# Patient Record
Sex: Female | Born: 1990 | Race: White | Hispanic: No | Marital: Married | Smoking: Never smoker
Health system: Southern US, Community
[De-identification: ages and names within clinical notes are randomized; demographics above are authoritative.]

## PROBLEM LIST (undated history)

## (undated) DIAGNOSIS — Z8489 Family history of other specified conditions: Secondary | ICD-10-CM

## (undated) DIAGNOSIS — E282 Polycystic ovarian syndrome: Secondary | ICD-10-CM

## (undated) DIAGNOSIS — J45909 Unspecified asthma, uncomplicated: Secondary | ICD-10-CM

## (undated) HISTORY — PX: GASTRIC BYPASS: SHX52

## (undated) HISTORY — PX: WISDOM TOOTH EXTRACTION: SHX21

## (undated) HISTORY — DX: Unspecified asthma, uncomplicated: J45.909

---
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
## 2009-10-13 HISTORY — PX: DILATION AND CURETTAGE OF UTERUS: SHX78

## 2011-10-15 ENCOUNTER — Other Ambulatory Visit (HOSPITAL_COMMUNITY): Payer: Self-pay | Admitting: Gastroenterology

## 2011-10-15 DIAGNOSIS — R1011 Right upper quadrant pain: Secondary | ICD-10-CM

## 2011-10-21 ENCOUNTER — Encounter (HOSPITAL_COMMUNITY)
Admission: RE | Admit: 2011-10-21 | Discharge: 2011-10-21 | Disposition: A | Discharge status: Home / Self Care | Payer: BC Managed Care – PPO | Source: Ambulatory Visit | Attending: Gastroenterology | Admitting: Gastroenterology

## 2011-10-21 DIAGNOSIS — R1011 Right upper quadrant pain: Secondary | ICD-10-CM

## 2011-10-21 MED ORDER — SINCALIDE 5 MCG IJ SOLR
INTRAMUSCULAR | Status: AC
  Administered 2011-10-21: 2.73 ug
  Filled 2011-10-21: qty 5

## 2011-10-21 MED ORDER — SINCALIDE 5 MCG IJ SOLR
INTRAMUSCULAR | Status: AC
  Administered 2011-10-21: 2.73 ug via INTRAVENOUS
  Filled 2011-10-21: qty 5

## 2011-10-21 MED ORDER — TECHNETIUM TC 99M MEBROFENIN IV KIT
5.0000 | PACK | Freq: Once | INTRAVENOUS | Status: AC | PRN
  Administered 2011-10-21: 5 via INTRAVENOUS

## 2011-10-21 MED ORDER — SINCALIDE 5 MCG IJ SOLR
0.0200 ug/kg | Freq: Once | INTRAMUSCULAR | Status: AC
  Administered 2011-10-21: 2.73 ug via INTRAVENOUS

## 2015-07-02 ENCOUNTER — Encounter: Payer: Self-pay | Admitting: Family Medicine

## 2015-07-02 ENCOUNTER — Ambulatory Visit (INDEPENDENT_AMBULATORY_CARE_PROVIDER_SITE_OTHER): Payer: 59 | Admitting: Family Medicine

## 2015-07-02 DIAGNOSIS — Z23 Encounter for immunization: Secondary | ICD-10-CM | POA: Diagnosis not present

## 2015-07-02 DIAGNOSIS — Z319 Encounter for procreative management, unspecified: Secondary | ICD-10-CM | POA: Diagnosis not present

## 2015-07-02 LAB — COMPREHENSIVE METABOLIC PANEL
ALT: 80 U/L — AB (ref 0–53)
AST: 50 U/L — AB (ref 0–37)
Albumin: 4.1 g/dL (ref 3.5–5.2)
Alkaline Phosphatase: 50 U/L (ref 39–117)
BILIRUBIN TOTAL: 0.4 mg/dL (ref 0.2–1.2)
BUN: 11 mg/dL (ref 6–23)
CHLORIDE: 103 meq/L (ref 96–112)
CO2: 30 meq/L (ref 19–32)
CREATININE: 0.68 mg/dL (ref 0.40–1.50)
Calcium: 9.5 mg/dL (ref 8.4–10.5)
GFR: 149.45 mL/min (ref >60.00)
GLUCOSE: 90 mg/dL (ref 70–99)
Potassium: 4.3 mEq/L (ref 3.5–5.1)
Sodium: 138 mEq/L (ref 135–145)
Total Protein: 7.4 g/dL (ref 6.0–8.3)

## 2015-07-02 LAB — TSH: TSH: 2.96 u[IU]/mL (ref 0.35–4.50)

## 2015-07-02 LAB — HEMOGLOBIN A1C: HEMOGLOBIN A1C: 5.6 % (ref 4.6–6.5)

## 2015-07-02 NOTE — Progress Notes (Signed)
Subjective:   Patient ID: Alexis Miller, female    DOB: [birth year], 24 y.o.   MRN: [id number]  Alexis Miller is a pleasant 24 y.o. year old female who presents to clinic today with Establish Care and Weight Gain  on 07/02/2015  HPI:  Currently trying to get pregnant.  On fourth round of femara through her OBGYN.  Wants to try lose weight.  Admits to eating out a lot.  Has tried low carb diet but no other diets.  Not exercising much now but was going to the gym almost nightly and got frustrated because she was not seeing any results.  No current outpatient prescriptions on file prior to visit.   No current facility-administered medications on file prior to visit.    No Known Allergies  Past Medical History  Diagnosis Date  . Asthma     Past Surgical History  Procedure Laterality Date  . Dilation and curettage of uterus  2011    Family History  Problem Relation Age of Onset  . Arthritis Mother   . Arthritis Father   . Hyperlipidemia Father   . Hypertension Father   . Arthritis Maternal Grandmother   . Arthritis Maternal Grandfather   . Heart disease Maternal Grandfather   . Hypertension Maternal Grandfather   . Arthritis Paternal Grandmother   . Arthritis Paternal Grandfather   . Hypertension Paternal Grandfather     Social History   Social History  . Marital Status: Married    Spouse Name: N/A  . Number of Children: N/A  . Years of Education: N/A   Occupational History  . Not on file.   Social History Main Topics  . Smoking status: Never Smoker   . Smokeless tobacco: Never Used  . Alcohol Use: No  . Drug Use: No  . Sexual Activity: Yes   Other Topics Concern  . Not on file   Social History Narrative  . No narrative on file   The PMH, PSH, Social History, Family History, Medications, and allergies have been reviewed in [facility], and have been updated if relevant.   Review of Systems  Constitutional: Negative.   HENT: Negative.   Respiratory: Negative.    Cardiovascular: Negative.   Gastrointestinal: Negative.   Endocrine: Negative.   Genitourinary: Negative.   Musculoskeletal: Negative.   Skin: Negative.   Allergic/Immunologic: Negative.   Neurological: Negative.   Hematological: Negative.   Psychiatric/Behavioral: Negative.   All other systems reviewed and are negative.      Objective:    BP 118/72 mmHg  Pulse 68  Temp(Src) 97.8 F (36.6 C) (Oral)  Ht  (1.702 m)  Wt 321 lb 4 oz (145.718 kg)  BMI 50.30 kg/m2  SpO2 96%   Physical Exam  Constitutional: She is oriented to person, place, and time. She appears well-developed and well-nourished.  obese  HENT:  Head: Normocephalic.  Eyes: Conjunctivae are normal.  Cardiovascular: Normal rate.   Pulmonary/Chest: Effort normal and breath sounds normal.  Musculoskeletal: Normal range of motion.  Neurological: She is alert and oriented to person, place, and time. No cranial nerve deficit.  Skin: Skin is warm and dry.  Psychiatric: She has a normal mood and affect. Her behavior is normal. Judgment and thought content normal.  Nursing note and vitals reviewed.         Assessment & Plan:   Need for influenza vaccination - Plan: Flu Vaccine QUAD 36+ mos PF IM (Fluarix & Fluzone Quad PF)  Morbid obesity No Follow-up  on file.

## 2015-07-02 NOTE — Assessment & Plan Note (Signed)
>  25 minutes spent in face to face time with patient, >50% spent in counselling or coordination of care Deteriorated. Declines referral to nutritionist. Wants to work on diet and come back to see me. We discussed phentermine which is NOT safe to be taken in pregnancy. She will touch base with me after this round of femara is complete. The patient indicates understanding of these issues and agrees with the plan.

## 2015-07-02 NOTE — Patient Instructions (Signed)
Great to meet you. We will call you with your lab results.  Please in touch with me.

## 2015-07-02 NOTE — Progress Notes (Signed)
Pre visit review using our clinic review tool, if applicable. No additional management support is needed unless otherwise documented below in the visit note. 

## 2015-07-23 ENCOUNTER — Ambulatory Visit (INDEPENDENT_AMBULATORY_CARE_PROVIDER_SITE_OTHER): Payer: 59 | Admitting: Family Medicine

## 2015-07-23 ENCOUNTER — Encounter: Payer: Self-pay | Admitting: Family Medicine

## 2015-07-23 MED ORDER — PHENTERMINE HCL 15 MG PO CAPS: 15.0000 mg | ORAL_CAPSULE | ORAL | Status: DC

## 2015-07-23 NOTE — Patient Instructions (Signed)
Great to see you. We are starting phentermine 15 mg daily.  Please come see in one month.

## 2015-07-23 NOTE — Progress Notes (Signed)
Subjective:   Patient ID: Alexis Miller, female    DOB: 17-Aug-1991, 24 y.o.   MRN: [id number]  Alexis Miller is a pleasant 24 y.o. year old female who presents to clinic today with Follow-up  on 07/23/2015  HPI:  Wants to try lose weight. Admits to eating out a lot. Has tried low carb diet but no other diets.  Not exercising much now but was going to the gym almost nightly and got frustrated because she was not seeing any results.  She and her boyfriend decided not to get pregnant now.  Stopped taking femara.  Wants to try to lose weight and get healthy first.  Lab Results  Component Value Date   TSH 2.96 07/02/2015   Wt Readings from Last 3 Encounters:  07/23/15 321 lb 8 oz (145.831 kg)  07/02/15 321 lb 4 oz (145.718 kg)    Current Outpatient Prescriptions on File Prior to Visit  Medication Sig Dispense Refill  . Prenatal Vit-Fe Fumarate-FA (PRENATAL MULTIVITAMIN) TABS tablet Take 1 tablet by mouth daily at 12 noon.     No current facility-administered medications on file prior to visit.    No Known Allergies  Past Medical History  Diagnosis Date  . Asthma     Past Surgical History  Procedure Laterality Date  . Dilation and curettage of uterus  2011    Family History  Problem Relation Age of Onset  . Arthritis Mother   . Arthritis Father   . Hyperlipidemia Father   . Hypertension Father   . Arthritis Maternal Grandmother   . Arthritis Maternal Grandfather   . Heart disease Maternal Grandfather   . Hypertension Maternal Grandfather   . Arthritis Paternal Grandmother   . Arthritis Paternal Grandfather   . Hypertension Paternal Grandfather     Social History   Social History  . Marital Status: Married    Spouse Name: N/A  . Number of Children: N/A  . Years of Education: N/A   Occupational History  . Not on file.   Social History Main Topics  . Smoking status: Never Smoker   . Smokeless tobacco: Never Used  . Alcohol Use: No  . Drug Use: No  .  Sexual Activity: Yes   Other Topics Concern  . Not on file   Social History Narrative   The PMH, PSH, Social History, Family History, Medications, and allergies have been reviewed in [facility], and have been updated if relevant.   Review of Systems  Constitutional: Negative.   Eyes: Negative.   Respiratory: Negative.   Cardiovascular: Negative.   Gastrointestinal: Negative.   Endocrine: Negative.   Genitourinary: Negative.   Musculoskeletal: Negative.   Skin: Negative.   Neurological: Negative.   Hematological: Negative.   Psychiatric/Behavioral: Negative.   All other systems reviewed and are negative.      Objective:    BP 128/70 mmHg  Pulse 85  Temp(Src) 98.4 F (36.9 C) (Oral)  Wt 321 lb 8 oz (145.831 kg)  SpO2 97%  LMP 07/18/2015   Physical Exam  Constitutional: She is oriented to person, place, and time. She appears well-developed and well-nourished. No distress.  HENT:  Head: Normocephalic and atraumatic.  Eyes: Conjunctivae are normal.  Cardiovascular: Normal rate and normal heart sounds.   Pulmonary/Chest: Effort normal.  Neurological: She is alert and oriented to person, place, and time. No cranial nerve deficit. Coordination normal.  Skin: Skin is warm and dry.  Psychiatric: She has a normal mood and affect. Her behavior is  normal. Judgment and thought content normal.  Nursing note and vitals reviewed.         Assessment & Plan:   Morbid obesity, unspecified obesity type (HCC) No Follow-up on file.

## 2015-07-23 NOTE — Assessment & Plan Note (Signed)
Discussed weight loss plan. She declines nutritionist referral. Pt would also like to discuss medication options- discussed phentermine risk benefits, side effects including HTN, pulmonary HTN, stroke.    She would like to start phentermine and lifestyle changes.  Follow up in 1 month.  If BMI < 27 will decrease to half dose x 1 month then stop

## 2015-07-23 NOTE — Progress Notes (Signed)
Pre visit review using our clinic review tool, if applicable. No additional management support is needed unless otherwise documented below in the visit note. 

## 2015-08-23 ENCOUNTER — Ambulatory Visit: Payer: 59 | Admitting: Family Medicine

## 2015-08-28 ENCOUNTER — Encounter: Payer: Self-pay | Admitting: Family Medicine

## 2015-08-28 ENCOUNTER — Ambulatory Visit (INDEPENDENT_AMBULATORY_CARE_PROVIDER_SITE_OTHER): Payer: 59 | Admitting: Family Medicine

## 2015-08-28 MED ORDER — PHENTERMINE HCL 30 MG PO CAPS: 30.0000 mg | ORAL_CAPSULE | ORAL | Status: DC

## 2015-08-28 NOTE — Assessment & Plan Note (Signed)
Doing well. Encouraged her to continue lifestyle changes. She would like to to increase dose of phentermine to 30 mg daily- rx printed and given to pt. She is aware of the risks of phentermine. Follow up in 3 months. The patient indicates understanding of these issues and agrees with the plan.

## 2015-08-28 NOTE — Progress Notes (Signed)
Subjective:   Patient ID: Alexis Miller, female    DOB: 1991/01/28, 24 y.o.   MRN: [id number]  Alexis Miller is a pleasant 24 y.o. year old female who presents to clinic today with Follow-up  on 08/28/2015  HPI:  Obesity- Started phentermine 15 mg daily last month.   Not exercising much now but was going to the gym almost nightly and got frustrated because she was not seeing any results.  Denies CP, SOB, HA, blurred vision or palpitations.  Doing well.  Feels like it is not suppressing her appetite as much as it did initially.   Lab Results  Component Value Date   TSH 2.96 07/02/2015   Wt Readings from Last 3 Encounters:  08/28/15 316 lb 4 oz (143.45 kg)  07/23/15 321 lb 8 oz (145.831 kg)  07/02/15 321 lb 4 oz (145.718 kg)    Current Outpatient Prescriptions on File Prior to Visit  Medication Sig Dispense Refill  . phentermine 15 MG capsule Take 1 capsule (15 mg total) by mouth every morning. 30 capsule 0  . Prenatal Vit-Fe Fumarate-FA (PRENATAL MULTIVITAMIN) TABS tablet Take 1 tablet by mouth daily at 12 noon.     No current facility-administered medications on file prior to visit.    No Known Allergies  Past Medical History  Diagnosis Date  . Asthma     Past Surgical History  Procedure Laterality Date  . Dilation and curettage of uterus  2011    Family History  Problem Relation Age of Onset  . Arthritis Mother   . Arthritis Father   . Hyperlipidemia Father   . Hypertension Father   . Arthritis Maternal Grandmother   . Arthritis Maternal Grandfather   . Heart disease Maternal Grandfather   . Hypertension Maternal Grandfather   . Arthritis Paternal Grandmother   . Arthritis Paternal Grandfather   . Hypertension Paternal Grandfather     Social History   Social History  . Marital Status: Married    Spouse Name: N/A  . Number of Children: N/A  . Years of Education: N/A   Occupational History  . Not on file.   Social History Main Topics  .  Smoking status: Never Smoker   . Smokeless tobacco: Never Used  . Alcohol Use: No  . Drug Use: No  . Sexual Activity: Yes   Other Topics Concern  . Not on file   Social History Narrative   The PMH, PSH, Social History, Family History, Medications, and allergies have been reviewed in [facility], and have been updated if relevant.   Review of Systems  Constitutional: Negative.   Eyes: Negative.   Respiratory: Negative.   Cardiovascular: Negative.   Gastrointestinal: Negative.   Endocrine: Negative.   Genitourinary: Negative.   Musculoskeletal: Negative.   Skin: Negative.   Neurological: Negative.   Hematological: Negative.   Psychiatric/Behavioral: Negative.   All other systems reviewed and are negative.      Objective:    BP 122/78 mmHg  Pulse 79  Temp(Src) 97.9 F (36.6 C) (Oral)  Wt 316 lb 4 oz (143.45 kg)  SpO2 98%  LMP 08/10/2015 (Approximate)   Physical Exam  Constitutional: She is oriented to person, place, and time. She appears well-developed and well-nourished. No distress.  HENT:  Head: Normocephalic and atraumatic.  Eyes: Conjunctivae are normal.  Cardiovascular: Normal rate and normal heart sounds.   Pulmonary/Chest: Effort normal.  Neurological: She is alert and oriented to person, place, and time. No cranial nerve deficit. Coordination normal.  Skin: Skin is warm and dry.  Psychiatric: She has a normal mood and affect. Her behavior is normal. Judgment and thought content normal.  Nursing note and vitals reviewed.         Assessment & Plan:   Morbid obesity, unspecified obesity type (HCC) No Follow-up on file.

## 2015-08-28 NOTE — Patient Instructions (Signed)
Great to see you. Please come see me in 3 months. 

## 2015-08-28 NOTE — Progress Notes (Signed)
Pre visit review using our clinic review tool, if applicable. No additional management support is needed unless otherwise documented below in the visit note. 

## 2015-11-28 ENCOUNTER — Ambulatory Visit (INDEPENDENT_AMBULATORY_CARE_PROVIDER_SITE_OTHER): Payer: BLUE CROSS/BLUE SHIELD | Admitting: Family Medicine

## 2015-11-28 ENCOUNTER — Encounter: Payer: Self-pay | Admitting: Family Medicine

## 2015-11-28 MED ORDER — PHENTERMINE HCL 37.5 MG PO CAPS: 37.5000 mg | ORAL_CAPSULE | ORAL | Status: DC

## 2015-11-28 NOTE — Patient Instructions (Signed)
Great to see you. We are increasing your phentermine to 37.5 mg daily.  Come see me in 3 months.  I am so proud of you!

## 2015-11-28 NOTE — Progress Notes (Signed)
Pre visit review using our clinic review tool, if applicable. No additional management support is needed unless otherwise documented below in the visit note. 

## 2015-11-28 NOTE — Progress Notes (Signed)
Subjective:   Patient ID: Alexis Miller, female    DOB: 1991-10-12, 25 y.o.   MRN: [id number]  Alexis Wain is a pleasant 25 y.o. year old female who presents to clinic today with Follow-up  on 11/28/2015  HPI:  Obesity- Started phentermine 15 mg daily in 07/2015.  Tolerated this well.  At follow up visit in 08/2015, dosage increased to 30 mg daily.  Denies CP, SOB, HA, blurred vision or palpitations.  Doing well.  Has lost a total of 20 pounds since starting phentermine!  Lab Results  Component Value Date   TSH 2.96 07/02/2015   Wt Readings from Last 3 Encounters:  11/28/15 304 lb 4 oz (138.007 kg)  08/28/15 316 lb 4 oz (143.45 kg)  07/23/15 321 lb 8 oz (145.831 kg)    Current Outpatient Prescriptions on File Prior to Visit  Medication Sig Dispense Refill  . phentermine 30 MG capsule Take 1 capsule (30 mg total) by mouth every morning. 30 capsule 2   No current facility-administered medications on file prior to visit.    No Known Allergies  Past Medical History  Diagnosis Date  . Asthma     Past Surgical History  Procedure Laterality Date  . Dilation and curettage of uterus  2011    Family History  Problem Relation Age of Onset  . Arthritis Mother   . Arthritis Father   . Hyperlipidemia Father   . Hypertension Father   . Arthritis Maternal Grandmother   . Arthritis Maternal Grandfather   . Heart disease Maternal Grandfather   . Hypertension Maternal Grandfather   . Arthritis Paternal Grandmother   . Arthritis Paternal Grandfather   . Hypertension Paternal Grandfather     Social History   Social History  . Marital Status: Married    Spouse Name: N/A  . Number of Children: N/A  . Years of Education: N/A   Occupational History  . Not on file.   Social History Main Topics  . Smoking status: Never Smoker   . Smokeless tobacco: Never Used  . Alcohol Use: No  . Drug Use: No  . Sexual Activity: Yes   Other Topics Concern  . Not on file    Social History Narrative   The PMH, PSH, Social History, Family History, Medications, and allergies have been reviewed in [facility], and have been updated if relevant.   Review of Systems  Constitutional: Negative.   Eyes: Negative.   Respiratory: Negative.   Cardiovascular: Negative.   Gastrointestinal: Negative.   Endocrine: Negative.   Genitourinary: Negative.   Musculoskeletal: Negative.   Skin: Negative.   Neurological: Negative.   Hematological: Negative.   Psychiatric/Behavioral: Negative.   All other systems reviewed and are negative.      Objective:    BP 116/76 mmHg  Pulse 88  Temp(Src) 98.5 F (36.9 C) (Oral)  Wt 304 lb 4 oz (138.007 kg)  LMP 11/04/2015   Physical Exam  Constitutional: She is oriented to person, place, and time. She appears well-developed and well-nourished. No distress.  HENT:  Head: Normocephalic and atraumatic.  Eyes: Conjunctivae are normal.  Cardiovascular: Normal rate and normal heart sounds.   Pulmonary/Chest: Effort normal.  Neurological: She is alert and oriented to person, place, and time. No cranial nerve deficit. Coordination normal.  Skin: Skin is warm and dry.  Psychiatric: She has a normal mood and affect. Her behavior is normal. Judgment and thought content normal.  Nursing note and vitals reviewed.  Assessment & Plan:   Morbid obesity, unspecified obesity type (HCC) No Follow-up on file.

## 2015-12-28 ENCOUNTER — Other Ambulatory Visit: Payer: Self-pay | Admitting: Family Medicine

## 2015-12-28 NOTE — Telephone Encounter (Signed)
Last f/u 11/2015 

## 2015-12-28 NOTE — Telephone Encounter (Signed)
Ok to refill one time only. 

## 2016-01-01 NOTE — Telephone Encounter (Signed)
Rx called in to requested pharmacy 

## 2016-02-01 ENCOUNTER — Other Ambulatory Visit: Payer: Self-pay | Admitting: Family Medicine

## 2016-02-24 DIAGNOSIS — J019 Acute sinusitis, unspecified: Secondary | ICD-10-CM | POA: Diagnosis not present

## 2016-02-25 ENCOUNTER — Ambulatory Visit: Payer: BLUE CROSS/BLUE SHIELD | Admitting: Family Medicine

## 2016-08-13 DIAGNOSIS — N390 Urinary tract infection, site not specified: Secondary | ICD-10-CM | POA: Diagnosis not present

## 2016-08-28 DIAGNOSIS — N926 Irregular menstruation, unspecified: Secondary | ICD-10-CM | POA: Diagnosis not present

## 2016-08-28 DIAGNOSIS — E282 Polycystic ovarian syndrome: Secondary | ICD-10-CM | POA: Diagnosis not present

## 2016-08-28 DIAGNOSIS — M545 Low back pain: Secondary | ICD-10-CM | POA: Diagnosis not present

## 2016-12-23 DIAGNOSIS — N390 Urinary tract infection, site not specified: Secondary | ICD-10-CM | POA: Diagnosis not present

## 2016-12-23 DIAGNOSIS — R300 Dysuria: Secondary | ICD-10-CM | POA: Diagnosis not present

## 2017-01-14 ENCOUNTER — Ambulatory Visit (INDEPENDENT_AMBULATORY_CARE_PROVIDER_SITE_OTHER): Payer: BLUE CROSS/BLUE SHIELD | Admitting: Family Medicine

## 2017-01-14 VITALS — BP 124/84 | HR 101 | Wt 335.8 lb

## 2017-01-14 DIAGNOSIS — M545 Low back pain: Secondary | ICD-10-CM

## 2017-01-14 LAB — POC URINALSYSI DIPSTICK (AUTOMATED)
BILIRUBIN UA: NEGATIVE
Glucose, UA: NEGATIVE
KETONES UA: NEGATIVE
Nitrite, UA: NEGATIVE
PH UA: 6 (ref 5.0–8.0)
PROTEIN UA: 15
Spec Grav, UA: 1.03 (ref 1.030–1.035)
Urobilinogen, UA: 0.2 (ref ?–2.0)

## 2017-01-14 MED ORDER — MELOXICAM 15 MG PO TABS: 15.0000 mg | ORAL_TABLET | Freq: Every day | ORAL | 0 refills | Status: DC

## 2017-01-14 NOTE — Progress Notes (Signed)
SUBJECTIVE:  Alexis Miller is a 26 y.o. female who complains of low back pain for 2 week(s), positional with bending or lifting, without radiation down the legs. Precipitating factors: none recalled by the patient. Prior history of back problems: recurrent self limited episodes of low back pain in the past. There is no numbness in the legs.   No current outpatient prescriptions on file prior to visit.   No current facility-administered medications on file prior to visit.     Allergies  Allergen Reactions  . Cephalosporins Hives    Past Medical History:  Diagnosis Date  . Asthma     Past Surgical History:  Procedure Laterality Date  . DILATION AND CURETTAGE OF UTERUS  2011    Family History  Problem Relation Age of Onset  . Arthritis Mother   . Arthritis Father   . Hyperlipidemia Father   . Hypertension Father   . Arthritis Maternal Grandmother   . Arthritis Maternal Grandfather   . Heart disease Maternal Grandfather   . Hypertension Maternal Grandfather   . Arthritis Paternal Grandmother   . Arthritis Paternal Grandfather   . Hypertension Paternal Grandfather     Social History   Social History  . Marital status: Married    Spouse name: N/A  . Number of children: N/A  . Years of education: N/A   Occupational History  . Not on file.   Social History Main Topics  . Smoking status: Never Smoker  . Smokeless tobacco: Never Used  . Alcohol use No  . Drug use: No  . Sexual activity: Yes   Other Topics Concern  . Not on file   Social History Narrative  . No narrative on file   The PMH, PSH, Social History, Family History, Medications, and allergies have been reviewed in [facility], and have been updated if relevant.  OBJECTIVE:  BP 124/84   Pulse (!) 101   Wt (!) 335 lb 12 oz (152.3 kg)   SpO2 97%   BMI 52.59 kg/m   Patient appears to be in mild to moderate pain, antalgic gait noted. Lumbosacral spine area reveals no local tenderness or mass.  Painful and  reduced LS ROM noted. Straight leg raise is negative bilaterally  ASSESSMENT:  lumbar strain and without radiculopathy  PLAN: For acute pain, rest, intermittent application of heat (do not sleep on heating pad), analgesics are recommended. Discussed longer term treatment plan of prn NSAID's and discussed a home back care exercise program with flexion exercise routine. Proper lifting with avoidance of heavy lifting discussed. Consider Physical Therapy and XRay studies if not improving. Call or return to clinic prn if these symptoms worsen or fail to improve as anticipated.

## 2017-01-14 NOTE — Patient Instructions (Signed)
Great to see you. Take meloxicam 15 mg daily with food for next two weeks. Please keep me updated.

## 2017-01-15 LAB — URINE CULTURE

## 2017-02-10 ENCOUNTER — Other Ambulatory Visit: Payer: Self-pay | Admitting: Family Medicine

## 2017-02-10 NOTE — Telephone Encounter (Signed)
Spoke to pt. She said her back is doing better and the pharmacy must have put it on auto refill. She said she did not need it

## 2017-02-10 NOTE — Telephone Encounter (Signed)
Need to call the pt to see how her back is doing.

## 2017-03-03 ENCOUNTER — Other Ambulatory Visit: Payer: Self-pay | Admitting: Family Medicine

## 2017-03-03 NOTE — Telephone Encounter (Signed)
Last refill 01/14/17 #30, last OV 01/14/17

## 2017-04-06 ENCOUNTER — Other Ambulatory Visit: Payer: Self-pay | Admitting: Family Medicine

## 2017-10-26 DIAGNOSIS — R1084 Generalized abdominal pain: Secondary | ICD-10-CM | POA: Diagnosis not present

## 2017-12-24 DIAGNOSIS — Z8742 Personal history of other diseases of the female genital tract: Secondary | ICD-10-CM | POA: Diagnosis not present

## 2017-12-24 DIAGNOSIS — Z01419 Encounter for gynecological examination (general) (routine) without abnormal findings: Secondary | ICD-10-CM | POA: Diagnosis not present

## 2017-12-24 DIAGNOSIS — Z6841 Body Mass Index (BMI) 40.0 and over, adult: Secondary | ICD-10-CM | POA: Diagnosis not present

## 2017-12-24 DIAGNOSIS — Z881 Allergy status to other antibiotic agents status: Secondary | ICD-10-CM | POA: Diagnosis not present

## 2018-01-25 DIAGNOSIS — E119 Type 2 diabetes mellitus without complications: Secondary | ICD-10-CM | POA: Diagnosis not present

## 2018-01-25 DIAGNOSIS — E282 Polycystic ovarian syndrome: Secondary | ICD-10-CM | POA: Diagnosis not present

## 2018-02-14 DIAGNOSIS — J069 Acute upper respiratory infection, unspecified: Secondary | ICD-10-CM | POA: Diagnosis not present

## 2019-12-08 DIAGNOSIS — M545 Low back pain: Secondary | ICD-10-CM | POA: Diagnosis not present

## 2019-12-08 DIAGNOSIS — Z6841 Body Mass Index (BMI) 40.0 and over, adult: Secondary | ICD-10-CM | POA: Diagnosis not present

## 2019-12-08 DIAGNOSIS — R350 Frequency of micturition: Secondary | ICD-10-CM | POA: Diagnosis not present

## 2019-12-29 DIAGNOSIS — Z20822 Contact with and (suspected) exposure to covid-19: Secondary | ICD-10-CM | POA: Diagnosis not present

## 2020-07-22 DIAGNOSIS — Z20822 Contact with and (suspected) exposure to covid-19: Secondary | ICD-10-CM | POA: Diagnosis not present

## 2020-07-22 DIAGNOSIS — J3089 Other allergic rhinitis: Secondary | ICD-10-CM | POA: Diagnosis not present

## 2020-08-13 DIAGNOSIS — U071 COVID-19: Secondary | ICD-10-CM | POA: Diagnosis not present

## 2020-08-13 DIAGNOSIS — Z20822 Contact with and (suspected) exposure to covid-19: Secondary | ICD-10-CM | POA: Diagnosis not present

## 2020-08-16 DIAGNOSIS — U071 COVID-19: Secondary | ICD-10-CM | POA: Diagnosis not present

## 2020-08-20 DIAGNOSIS — H1030 Unspecified acute conjunctivitis, unspecified eye: Secondary | ICD-10-CM | POA: Diagnosis not present

## 2020-08-20 DIAGNOSIS — R059 Cough, unspecified: Secondary | ICD-10-CM | POA: Diagnosis not present

## 2020-10-01 ENCOUNTER — Ambulatory Visit
Admission: EM | Admit: 2020-10-01 | Discharge: 2020-10-01 | Disposition: A | Discharge status: Home / Self Care | Payer: BLUE CROSS/BLUE SHIELD | Attending: Sports Medicine | Admitting: Sports Medicine

## 2020-10-01 ENCOUNTER — Encounter: Payer: Self-pay | Admitting: Emergency Medicine

## 2020-10-01 ENCOUNTER — Other Ambulatory Visit: Payer: Self-pay

## 2020-10-01 DIAGNOSIS — J0100 Acute maxillary sinusitis, unspecified: Secondary | ICD-10-CM

## 2020-10-01 DIAGNOSIS — H9202 Otalgia, left ear: Secondary | ICD-10-CM

## 2020-10-01 MED ORDER — AMOXICILLIN-POT CLAVULANATE 875-125 MG PO TABS: 1.0000 | ORAL_TABLET | Freq: Two times a day (BID) | ORAL | 0 refills | Status: DC

## 2020-10-01 NOTE — Discharge Instructions (Addendum)
I did recommend going ahead and treating her for the sinusitis.  We will give her Augmentin for 7 days. Over-the-counter meds as needed, Mucinex over-the-counter, cough medicine as needed.  Supportive care. Follow-up here or with her primary care physician should symptoms not improve or worsen.

## 2020-10-01 NOTE — ED Triage Notes (Addendum)
Pt c/o nasal congestion, post nasal drainage, sinus pressure, headache, cough and left ear pain. Started about a week ago. Denies fever. Pt states she tested positive for covid on November 1st 2021

## 2020-10-01 NOTE — ED Provider Notes (Signed)
[facility]    CSN: [id number] Arrival date & time: 10/01/20  1757      History   Chief Complaint Chief Complaint  Patient presents with  . Nasal Congestion         HPI [place] R Riggenbach is a 29 y.o. female.   Pleasant 29 year old female who presents for evaluation of 7 to 10 days of upper respiratory tract symptoms.  She reports sinus pressure congestion postnasal drip and left ear and throat pain.  She denies any fever shakes chills.  She did have Covid 7 weeks ago.  She has not been vaccinated against Covid or influenza yet.  She denies any exposure to influenza or Covid.  She denies any nausea vomiting diarrhea abdominal pain or urinary symptoms.  Given the fact that has been going on for 7 to 10 days she presents today for initial evaluation in the urgent care.  No red flag signs or symptoms offered by the patient.       Past Medical History:  Diagnosis Date  . Asthma     Patient Active Problem List   Diagnosis Date Noted  . Morbid obesity (HCC) 07/02/2015    Past Surgical History:  Procedure Laterality Date  . DILATION AND CURETTAGE OF UTERUS  2011    OB History   No obstetric history on file.      Home Medications    Prior to Admission medications   Medication Sig Start Date End Date Taking? Authorizing Provider  amoxicillin-clavulanate (AUGMENTIN) 875-125 MG tablet Take 1 tablet by mouth every 12 (twelve) hours. 10/01/20   Delton See, MD  meloxicam (MOBIC) 15 MG tablet TAKE 1 TABLET (15 MG TOTAL) BY MOUTH DAILY. 04/06/17   Dianne Dun, MD    Family History Family History  Problem Relation Age of Onset  . Arthritis Mother   . Arthritis Father   . Hyperlipidemia Father   . Hypertension Father   . Arthritis Maternal Grandmother   . Arthritis Maternal Grandfather   . Heart disease Maternal Grandfather   . Hypertension Maternal Grandfather   . Arthritis Paternal Grandmother   . Arthritis Paternal Grandfather   . Hypertension  Paternal Grandfather     Social History Social History   Tobacco Use  . Smoking status: Never Smoker  . Smokeless tobacco: Never Used  Vaping Use  . Vaping Use: Never used  Substance Use Topics  . Alcohol use: No    Alcohol/week: 0.0 standard drinks  . Drug use: No     Allergies   Cephalosporins   Review of Systems Review of Systems  Constitutional: Negative for activity change, appetite change, chills, fatigue and fever.  HENT: Positive for congestion, ear pain, postnasal drip, sinus pressure, sinus pain and sore throat.   Eyes: Negative for pain.  Respiratory: Positive for cough. Negative for choking, chest tightness, shortness of breath, wheezing and stridor.   Cardiovascular: Negative for chest pain.  Gastrointestinal: Positive for abdominal pain.  Genitourinary: Negative for dysuria.  Skin: Negative for color change, pallor, rash and wound.  Neurological: Negative for dizziness.  All other systems reviewed and are negative.    Physical Exam Triage Vital Signs ED Triage Vitals  Enc Vitals Group     BP 10/01/20 1854 (!) 157/90     Pulse Rate 10/01/20 1854 83     Resp 10/01/20 1854 18     Temp 10/01/20 1854 98.2 F (36.8 C)     Temp Source 10/01/20 1854 Oral  SpO2 10/01/20 1854 99 %     Weight 10/01/20 1853 (!) 335 lb 12.2 oz (152.3 kg)     Height 10/01/20 1853 5\' 7"  (1.702 m)     Head Circumference --      Peak Flow --      Pain Score 10/01/20 1852 8     Pain Loc --      Pain Edu? --      Excl. in GC? --    No data found.  Updated Vital Signs BP (!) 157/90 (BP Location: Right Arm)   Pulse 83   Temp 98.2 F (36.8 C) (Oral)   Resp 18   Ht 5\' 7"  (1.702 m)   Wt (!) 152.3 kg   LMP 09/17/2020 (Approximate)   SpO2 99%   BMI 52.59 kg/m   Visual Acuity Right Eye Distance:   Left Eye Distance:   Bilateral Distance:    Right Eye Near:   Left Eye Near:    Bilateral Near:     Physical Exam Vitals and nursing note reviewed.  Constitutional:       General: She is not in acute distress.    Appearance: Normal appearance. She is not ill-appearing, toxic-appearing or diaphoretic.  HENT:     Head: Normocephalic and atraumatic.     Right Ear: Tympanic membrane normal.     Left Ear: Tympanic membrane normal.     Nose: Nose normal.     Mouth/Throat:     Mouth: Mucous membranes are moist.     Pharynx: Oropharynx is clear. No posterior oropharyngeal erythema.  Eyes:     Extraocular Movements: Extraocular movements intact.     Conjunctiva/sclera: Conjunctivae normal.     Pupils: Pupils are equal, round, and reactive to light.  Cardiovascular:     Rate and Rhythm: Normal rate and regular rhythm.     Pulses: Normal pulses.     Heart sounds: Normal heart sounds. No murmur heard. No friction rub. No gallop.   Pulmonary:     Effort: Pulmonary effort is normal. No respiratory distress.     Breath sounds: Normal breath sounds. No stridor. No wheezing, rhonchi or rales.  Musculoskeletal:     Cervical back: Normal range of motion and neck supple.  Lymphadenopathy:     Cervical: Cervical adenopathy present.  Skin:    General: Skin is warm and dry.     Capillary Refill: Capillary refill takes less than 2 seconds.  Neurological:     General: No focal deficit present.     Mental Status: She is alert and oriented to person, place, and time.  Psychiatric:        Mood and Affect: Mood normal.      UC Treatments / Results  Labs (all labs ordered are listed, but only abnormal results are displayed) Labs Reviewed - No data to display  EKG   Radiology No results found.  Procedures Procedures (including critical care time)  Medications Ordered in UC Medications - No data to display  Initial Impression / Assessment and Plan / UC Course  I have reviewed the triage vital signs and the nursing notes.  Pertinent labs & imaging results that were available during my care of the patient were reviewed by me and considered in my medical  decision making (see chart for details).  Clinical impression: 7 to 10 days of sinus pressure congestion, postnasal drip and left ear pain.  Exam and clinical presentation consistent with subacute sinusitis.  Patient did have Covid  7 weeks ago.  Treatment plan: 1.  The findings and treat plan were discussed detail the patient.  Patient was in agreement. 2.  I did recommend going ahead and treating her for the sinusitis.  We will give her Augmentin for 7 days. 3.  Over-the-counter meds as needed, Mucinex over-the-counter, cough medicine as needed.  Supportive care. 4.  Follow-up here or with her primary care physician should symptoms not improve or worsen.     Final Clinical Impressions(s) / UC Diagnoses   Final diagnoses:  Subacute maxillary sinusitis  Left ear pain     Discharge Instructions     I did recommend going ahead and treating her for the sinusitis.  We will give her Augmentin for 7 days. Over-the-counter meds as needed, Mucinex over-the-counter, cough medicine as needed.  Supportive care. Follow-up here or with her primary care physician should symptoms not improve or worsen.    ED Prescriptions    Medication Sig Dispense Auth. Provider   amoxicillin-clavulanate (AUGMENTIN) 875-125 MG tablet Take 1 tablet by mouth every 12 (twelve) hours. 14 tablet Delton See, MD     PDMP not reviewed this encounter.   Delton See, MD 10/01/20 [phone]

## 2020-10-13 DIAGNOSIS — M5126 Other intervertebral disc displacement, lumbar region: Secondary | ICD-10-CM

## 2020-10-13 HISTORY — DX: Other intervertebral disc displacement, lumbar region: M51.26

## 2020-10-16 ENCOUNTER — Ambulatory Visit
Admission: EM | Admit: 2020-10-16 | Discharge: 2020-10-16 | Disposition: A | Discharge status: Home / Self Care | Payer: BLUE CROSS/BLUE SHIELD | Attending: Family Medicine | Admitting: Family Medicine

## 2020-10-16 ENCOUNTER — Encounter: Payer: Self-pay | Admitting: Emergency Medicine

## 2020-10-16 ENCOUNTER — Other Ambulatory Visit: Payer: Self-pay

## 2020-10-16 DIAGNOSIS — J4541 Moderate persistent asthma with (acute) exacerbation: Secondary | ICD-10-CM

## 2020-10-16 DIAGNOSIS — R059 Cough, unspecified: Secondary | ICD-10-CM

## 2020-10-16 MED ORDER — PREDNISONE 10 MG (21) PO TBPK: ORAL_TABLET | Freq: Every day | ORAL | 0 refills | Status: AC

## 2020-10-16 MED ORDER — GUAIFENESIN-CODEINE 100-10 MG/5ML PO SYRP: 5.0000 mL | ORAL_SOLUTION | Freq: Three times a day (TID) | ORAL | 0 refills | Status: DC | PRN

## 2020-10-16 NOTE — ED Provider Notes (Incomplete)
[facility]    CSN: [id number] Arrival date & time: 10/16/20  1701      History   Chief Complaint No chief complaint on file.   HPI Alexis Miller is a 30 y.o. female.   HPI  Past Medical History:  Diagnosis Date  . Asthma     Patient Active Problem List   Diagnosis Date Noted  . Morbid obesity (HCC) 07/02/2015    Past Surgical History:  Procedure Laterality Date  . DILATION AND CURETTAGE OF UTERUS  2011    OB History   No obstetric history on file.      Home Medications    Prior to Admission medications   Medication Sig Start Date End Date Taking? Authorizing Provider  amoxicillin-clavulanate (AUGMENTIN) 875-125 MG tablet Take 1 tablet by mouth every 12 (twelve) hours. 10/01/20   Delton See, MD  meloxicam (MOBIC) 15 MG tablet TAKE 1 TABLET (15 MG TOTAL) BY MOUTH DAILY. 04/06/17   Dianne Dun, MD    Family History Family History  Problem Relation Age of Onset  . Arthritis Mother   . Arthritis Father   . Hyperlipidemia Father   . Hypertension Father   . Arthritis Maternal Grandmother   . Arthritis Maternal Grandfather   . Heart disease Maternal Grandfather   . Hypertension Maternal Grandfather   . Arthritis Paternal Grandmother   . Arthritis Paternal Grandfather   . Hypertension Paternal Grandfather     Social History Social History   Tobacco Use  . Smoking status: Never Smoker  . Smokeless tobacco: Never Used  Vaping Use  . Vaping Use: Never used  Substance Use Topics  . Alcohol use: No    Alcohol/week: 0.0 standard drinks  . Drug use: No     Allergies   Cephalosporins   Review of Systems Review of Systems   Physical Exam Triage Vital Signs ED Triage Vitals  Enc Vitals Group     BP 10/16/20 1846 132/80     Pulse Rate 10/16/20 1846 87     Resp 10/16/20 1846 16     Temp 10/16/20 1846 98.7 F (37.1 C)     Temp Source 10/16/20 1846 Tympanic     SpO2 10/16/20 1846 93 %     Weight 10/16/20 1850 (!) 345 lb  (156.5 kg)     Height 10/16/20 1850 5\' 7"  (1.702 m)     Head Circumference --      Peak Flow --      Pain Score 10/16/20 1850 0     Pain Loc --      Pain Edu? --      Excl. in GC? --    No data found.  Updated Vital Signs BP 132/80 (BP Location: Right Arm)   Pulse 87   Temp 98.7 F (37.1 C) (Tympanic)   Resp 16   Ht 5\' 7"  (1.702 m)   Wt (!) 345 lb (156.5 kg)   LMP 09/17/2020 (Approximate)   SpO2 93%   BMI 54.03 kg/m   Visual Acuity Right Eye Distance:   Left Eye Distance:   Bilateral Distance:    Right Eye Near:   Left Eye Near:    Bilateral Near:     Physical Exam   UC Treatments / Results  Labs (all labs ordered are listed, but only abnormal results are displayed) Labs Reviewed - No data to display  EKG   Radiology No results found.  Procedures Procedures (including critical care time)  Medications  Ordered in UC Medications - No data to display  Initial Impression / Assessment and Plan / UC Course  I have reviewed the triage vital signs and the nursing notes.  Pertinent labs & imaging results that were available during my care of the patient were reviewed by me and considered in my medical decision making (see chart for details).     *** Final Clinical Impressions(s) / UC Diagnoses   Final diagnoses:  None   Discharge Instructions   None    ED Prescriptions    None     PDMP not reviewed this encounter.

## 2020-10-16 NOTE — ED Triage Notes (Signed)
C/o dry wheezing cough

## 2020-10-16 NOTE — Discharge Instructions (Addendum)
I have sent in a prednisone taper for you to take for 6 days. 6 tablets on day one, 5 tablets on day two, 4 tablets on day three, 3 tablets on day four, 2 tablets on day five, and 1 tablet on day six.  I have sent in cough syrup for you to take. This medication can make you sleepy. Do not drive while taking this medication.  Follow up with this office or with primary care if symptoms are persisting.  Follow up in the ER for high fever, trouble swallowing, trouble breathing, other concerning symptoms.  

## 2020-10-19 NOTE — ED Provider Notes (Signed)
[facility]   [id number] 10/16/20 Arrival Time: 1701   SUBJECTIVE:  Alexis Miller is a 30 y.o. female who presents with complaint of nasal congestion, post-nasal drainage, and a persistent cough. Onset abrupt approximately 1 month ago. Overall fatigued. SOB: mild. Wheezing: mild. Has negative history of Covid. Has not completed Covid vaccines. OTC treatment: none. Social History   Tobacco Use  Smoking Status Never Smoker  Smokeless Tobacco Never Used    ROS: As per HPI.   OBJECTIVE:  Vitals:   10/16/20 1846 10/16/20 1850  BP: 132/80   Pulse: 87   Resp: 16   Temp: 98.7 F (37.1 C)   TempSrc: Tympanic   SpO2: 93%   Weight:  (!) 345 lb (156.5 kg)  Height:  5\' 7"  (1.702 m)     General appearance: alert; no distress HEENT: nasal congestion; clear runny nose; throat irritation secondary to post-nasal drainage Neck: supple without LAD Lungs: diffuse wheezing to bilateral lung fields Skin: warm and dry Psychological: alert and cooperative; normal mood and affect  Results for orders placed or performed in visit on 01/14/17  Urine culture   Specimen: Urine  Result Value Ref Range   Organism ID, Bacteria      Multiple organisms present,each less than 10,000 CFU/mL. These organisms,commonly found on external and internal genitalia,are considered colonizers. No further testing performed.   POCT Urinalysis Dipstick (Automated)  Result Value Ref Range   Color, UA Amber    Clarity, UA Cloudy    Glucose, UA neg    Bilirubin, UA neg    Ketones, UA neg    Spec Grav, UA 1.030 1.030 - 1.035   Blood, UA 3+    pH, UA 6.0 5.0 - 8.0   Protein, UA 15    Urobilinogen, UA 0.2 Negative - 2.0   Nitrite, UA neg    Leukocytes, UA small (1+) (A) Negative    Labs Reviewed - No data to display  No results found.  Allergies  Allergen Reactions  . Cephalosporins Hives    Past Medical History:  Diagnosis Date  . Asthma    Social History   Socioeconomic  History  . Marital status: Married    Spouse name: Not on file  . Number of children: Not on file  . Years of education: Not on file  . Highest education level: Not on file  Occupational History  . Not on file  Tobacco Use  . Smoking status: Never Smoker  . Smokeless tobacco: Never Used  Vaping Use  . Vaping Use: Never used  Substance and Sexual Activity  . Alcohol use: No    Alcohol/week: 0.0 standard drinks  . Drug use: No  . Sexual activity: Yes  Other Topics Concern  . Not on file  Social History Narrative  . Not on file   Social Determinants of Health   Financial Resource Strain: Not on file  Food Insecurity: Not on file  Transportation Needs: Not on file  Physical Activity: Not on file  Stress: Not on file  Social Connections: Not on file  Intimate Partner Violence: Not on file   Family History  Problem Relation Age of Onset  . Arthritis Mother   . Arthritis Father   . Hyperlipidemia Father   . Hypertension Father   . Arthritis Maternal Grandmother   . Arthritis Maternal Grandfather   . Heart disease Maternal Grandfather   . Hypertension Maternal Grandfather   . Arthritis Paternal Grandmother   . Arthritis Paternal  Grandfather   . Hypertension Paternal Grandfather      ASSESSMENT & PLAN:  1. Moderate persistent asthma with acute exacerbation   2. Cough     Meds ordered this encounter  Medications  . predniSONE (STERAPRED UNI-PAK 21 TAB) 10 MG (21) TBPK tablet    Sig: Take by mouth daily for 6 days. Take 6 tablets on day 1, 5 tablets on day 2, 4 tablets on day 3, 3 tablets on day 4, 2 tablets on day 5, 1 tablet on day 6    Dispense:  21 tablet    Refill:  0    Order Specific Question:   Supervising Provider    Answer:   Merrilee Jansky [id number]  . guaiFENesin-codeine (ROBITUSSIN AC) 100-10 MG/5ML syrup    Sig: Take 5 mLs by mouth 3 (three) times daily as needed for cough.    Dispense:  120 mL    Refill:  0    Order Specific Question:    Supervising Provider    Answer:   Merrilee Jansky [id number]    Prescribed steroid taper. Prescribed cough syrup Sedation precautions given OTC symptom care as needed. Will plan f/u with PCP or here as needed.  Reviewed expectations re: course of current medical issues. Questions answered. Outlined signs and symptoms indicating need for more acute intervention. Patient verbalized understanding. After Visit Summary given.           Moshe Cipro, NP 10/19/20 1404

## 2021-01-29 DIAGNOSIS — M545 Low back pain, unspecified: Secondary | ICD-10-CM | POA: Diagnosis not present

## 2021-01-29 DIAGNOSIS — Z1331 Encounter for screening for depression: Secondary | ICD-10-CM | POA: Diagnosis not present

## 2021-01-29 DIAGNOSIS — Z0000 Encounter for general adult medical examination without abnormal findings: Secondary | ICD-10-CM | POA: Diagnosis not present

## 2021-03-21 DIAGNOSIS — Z6841 Body Mass Index (BMI) 40.0 and over, adult: Secondary | ICD-10-CM | POA: Diagnosis not present

## 2021-03-21 DIAGNOSIS — Z01419 Encounter for gynecological examination (general) (routine) without abnormal findings: Secondary | ICD-10-CM | POA: Diagnosis not present

## 2021-03-21 DIAGNOSIS — Z1151 Encounter for screening for human papillomavirus (HPV): Secondary | ICD-10-CM | POA: Diagnosis not present

## 2021-03-21 DIAGNOSIS — Z3202 Encounter for pregnancy test, result negative: Secondary | ICD-10-CM | POA: Diagnosis not present

## 2021-03-21 DIAGNOSIS — M545 Low back pain, unspecified: Secondary | ICD-10-CM | POA: Diagnosis not present

## 2021-03-21 DIAGNOSIS — Z7984 Long term (current) use of oral hypoglycemic drugs: Secondary | ICD-10-CM | POA: Diagnosis not present

## 2021-03-21 DIAGNOSIS — E781 Pure hyperglyceridemia: Secondary | ICD-10-CM | POA: Diagnosis not present

## 2021-03-21 DIAGNOSIS — E282 Polycystic ovarian syndrome: Secondary | ICD-10-CM | POA: Diagnosis not present

## 2021-03-21 DIAGNOSIS — Z01411 Encounter for gynecological examination (general) (routine) with abnormal findings: Secondary | ICD-10-CM | POA: Diagnosis not present

## 2021-03-22 DIAGNOSIS — Z6841 Body Mass Index (BMI) 40.0 and over, adult: Secondary | ICD-10-CM | POA: Diagnosis not present

## 2021-03-22 DIAGNOSIS — M545 Low back pain, unspecified: Secondary | ICD-10-CM | POA: Diagnosis not present

## 2021-03-22 DIAGNOSIS — M5440 Lumbago with sciatica, unspecified side: Secondary | ICD-10-CM | POA: Diagnosis not present

## 2021-03-22 DIAGNOSIS — M47816 Spondylosis without myelopathy or radiculopathy, lumbar region: Secondary | ICD-10-CM | POA: Diagnosis not present

## 2021-04-29 DIAGNOSIS — M5440 Lumbago with sciatica, unspecified side: Secondary | ICD-10-CM | POA: Diagnosis not present

## 2021-04-29 DIAGNOSIS — Z6841 Body Mass Index (BMI) 40.0 and over, adult: Secondary | ICD-10-CM | POA: Diagnosis not present

## 2021-05-13 DIAGNOSIS — M5416 Radiculopathy, lumbar region: Secondary | ICD-10-CM | POA: Diagnosis not present

## 2021-05-13 DIAGNOSIS — M5136 Other intervertebral disc degeneration, lumbar region: Secondary | ICD-10-CM | POA: Diagnosis not present

## 2021-05-13 DIAGNOSIS — M5441 Lumbago with sciatica, right side: Secondary | ICD-10-CM | POA: Diagnosis not present

## 2021-05-29 DIAGNOSIS — M5136 Other intervertebral disc degeneration, lumbar region: Secondary | ICD-10-CM | POA: Diagnosis not present

## 2021-05-29 DIAGNOSIS — M5137 Other intervertebral disc degeneration, lumbosacral region: Secondary | ICD-10-CM | POA: Diagnosis not present

## 2021-06-20 ENCOUNTER — Emergency Department
Admission: EM | Admit: 2021-06-20 | Discharge: 2021-06-20 | Disposition: A | Discharge status: Home / Self Care | Payer: BLUE CROSS/BLUE SHIELD | Attending: Emergency Medicine | Admitting: Emergency Medicine

## 2021-06-20 ENCOUNTER — Emergency Department: Payer: BLUE CROSS/BLUE SHIELD

## 2021-06-20 ENCOUNTER — Other Ambulatory Visit: Payer: Self-pay

## 2021-06-20 DIAGNOSIS — M5116 Intervertebral disc disorders with radiculopathy, lumbar region: Secondary | ICD-10-CM | POA: Diagnosis not present

## 2021-06-20 DIAGNOSIS — M545 Low back pain, unspecified: Secondary | ICD-10-CM | POA: Diagnosis not present

## 2021-06-20 DIAGNOSIS — J45909 Unspecified asthma, uncomplicated: Secondary | ICD-10-CM | POA: Insufficient documentation

## 2021-06-20 DIAGNOSIS — M5126 Other intervertebral disc displacement, lumbar region: Secondary | ICD-10-CM | POA: Diagnosis not present

## 2021-06-20 MED ORDER — OXYCODONE-ACETAMINOPHEN 5-325 MG PO TABS
1.0000 | ORAL_TABLET | Freq: Once | ORAL | Status: AC
  Administered 2021-06-20: 1 via ORAL
  Filled 2021-06-20: qty 1

## 2021-06-20 MED ORDER — ONDANSETRON 4 MG PO TBDP: 4.0000 mg | ORAL_TABLET | Freq: Three times a day (TID) | ORAL | 0 refills | Status: DC | PRN

## 2021-06-20 MED ORDER — PREDNISONE 10 MG (21) PO TBPK: ORAL_TABLET | ORAL | 0 refills | Status: DC

## 2021-06-20 MED ORDER — OXYCODONE-ACETAMINOPHEN 5-325 MG PO TABS: 1.0000 | ORAL_TABLET | Freq: Four times a day (QID) | ORAL | 0 refills | Status: AC | PRN

## 2021-06-20 MED ORDER — ONDANSETRON 4 MG PO TBDP
8.0000 mg | ORAL_TABLET | Freq: Once | ORAL | Status: AC
  Administered 2021-06-20: 8 mg via ORAL
  Filled 2021-06-20: qty 2

## 2021-06-20 MED ORDER — GABAPENTIN 100 MG PO CAPS: ORAL_CAPSULE | ORAL | 0 refills | Status: DC

## 2021-06-20 MED ORDER — KETOROLAC TROMETHAMINE 60 MG/2ML IM SOLN
15.0000 mg | Freq: Once | INTRAMUSCULAR | Status: AC
  Administered 2021-06-20: 15 mg via INTRAMUSCULAR
  Filled 2021-06-20: qty 2

## 2021-06-20 MED ORDER — KETOROLAC TROMETHAMINE 10 MG PO TABS: 10.0000 mg | ORAL_TABLET | Freq: Four times a day (QID) | ORAL | 0 refills | Status: DC | PRN

## 2021-06-20 NOTE — ED Notes (Signed)
Patient transported to MRI 

## 2021-06-20 NOTE — ED Notes (Signed)
Pt ambulating in room, gait steady, pt with c/o increased pain with movement. Pt states she does not need an assistive device to ambulate. Pt able to get OOB with minimal assist, pt able to get back in bed without assist. EDP aware.

## 2021-06-20 NOTE — Discharge Instructions (Signed)
Take famotidine 20mg  twice a day as needed for stomach upset symptoms which may arise as a side effect of the pain medications.

## 2021-06-20 NOTE — ED Provider Notes (Signed)
[facility] Emergency Department Provider Note  ____________________________________________  Time seen: Approximately 11:39 AM  I have reviewed the triage vital signs and the nursing notes.   HISTORY  Chief Complaint Back Pain (lower)    HPI Alexis Miller is a 30 y.o. female with a history of asthma and obesity who comes ED complaining of severe constant low back pain radiating down both legs.  Worse with movement or trying to get up and walk.  This has been going on for the past 3 or 4 months.  She has seen orthopedics, done physical therapy and a steroid taper which temporarily helped.  Overall it is continuing to worsen, now limiting her mobility.  Feels like her right leg is getting weak, no falls.  No bowel or bladder incontinence or retention.  No numbness in the thighs.    Past Medical History:  Diagnosis Date   Asthma      Patient Active Problem List   Diagnosis Date Noted   Morbid obesity (HCC) 07/02/2015     Past Surgical History:  Procedure Laterality Date   DILATION AND CURETTAGE OF UTERUS  2011     Prior to Admission medications   Medication Sig Start Date End Date Taking? Authorizing Provider  gabapentin (NEURONTIN) 100 MG capsule Start with 100mg  three times daily by mouth for one week.  If symptoms are not improved, you may then increase the dose to 200mg  by mouth three times daily.  Remain at this dose until you follow up with primary care. 06/20/21  Yes , MD  ketorolac (TORADOL) 10 MG tablet Take 1 tablet (10 mg total) by mouth every 6 (six) hours as needed for moderate pain. 06/20/21  Yes Sharman Cheek, MD  ondansetron (ZOFRAN ODT) 4 MG disintegrating tablet Take 1 tablet (4 mg total) by mouth every 8 (eight) hours as needed for nausea or vomiting. 06/20/21  Yes Sharman Cheek, MD  oxyCODONE-acetaminophen (PERCOCET) 5-325 MG tablet Take 1 tablet by mouth every 6 (six) hours as needed for up to 3 days for  severe pain. 06/20/21 06/23/21 Yes 08/20/21, MD  predniSONE (STERAPRED UNI-PAK 21 TAB) 10 MG (21) TBPK tablet 6 tablets on day 1, then 5 tablets on day 2, then 4 tablets on day 3, then 3 tablets on day 4, then 2 tablets on day 5, then 1 tablet on day 6. 06/20/21  Yes Sharman Cheek, MD  amoxicillin-clavulanate (AUGMENTIN) 875-125 MG tablet Take 1 tablet by mouth every 12 (twelve) hours. 10/01/20   Sharman Cheek, MD  guaiFENesin-codeine [facility]) 100-10 MG/5ML syrup Take 5 mLs by mouth 3 (three) times daily as needed for cough. 10/16/20   [facility], NP  meloxicam (MOBIC) 15 MG tablet TAKE 1 TABLET (15 MG TOTAL) BY MOUTH DAILY. 04/06/17   Moshe Cipro, MD     Allergies Cephalosporins   Family History  Problem Relation Age of Onset   Arthritis Mother    Arthritis Father    Hyperlipidemia Father    Hypertension Father    Arthritis Maternal Grandmother    Arthritis Maternal Grandfather    Heart disease Maternal Grandfather    Hypertension Maternal Grandfather    Arthritis Paternal Grandmother    Arthritis Paternal Grandfather    Hypertension Paternal Grandfather     Social History Social History   Tobacco Use   Smoking status: Never   Smokeless tobacco: Never  Vaping Use   Vaping Use: Never used  Substance Use Topics   Alcohol  use: No    Alcohol/week: 0.0 standard drinks   Drug use: No    Review of Systems  Constitutional:   No fever or chills.  ENT:   No sore throat. No rhinorrhea. Cardiovascular:   No chest pain or syncope. Respiratory:   No dyspnea or cough. Gastrointestinal:   Negative for abdominal pain, vomiting and diarrhea.  Musculoskeletal:   Radiating low back pain as above. All other systems reviewed and are negative except as documented above in ROS and HPI.  ____________________________________________   PHYSICAL EXAM:  VITAL SIGNS: ED Triage Vitals  Enc Vitals Group     BP 06/20/21 0414 (!) 141/71     Pulse Rate 06/20/21 0414  89     Resp 06/20/21 0414 16     Temp 06/20/21 0414 98 F (36.7 C)     Temp Source 06/20/21 0414 Oral     SpO2 06/20/21 0414 97 %     Weight 06/20/21 0415 (!) 350 lb (158.8 kg)     Height 06/20/21 0415 5\' 7"  (1.702 m)     Head Circumference --      Peak Flow --      Pain Score 06/20/21 0415 9     Pain Loc --      Pain Edu? --      Excl. in GC? --     Vital signs reviewed, nursing assessments reviewed.   Constitutional:   Alert and oriented. Non-toxic appearance. Eyes:   Conjunctivae are normal. EOMI. PERRL. ENT      Head:   Normocephalic and atraumatic.      Nose:   Wearing a mask.      Mouth/Throat:   Wearing a mask.      Neck:   No meningismus. Full ROM.  Cardiovascular:   RRR. Symmetric bilateral  DP pulses.  Cap refill less than 2 seconds. Respiratory:   Normal respiratory effort without tachypnea/retractions. Musculoskeletal:    No joint effusions.  No lower extremity tenderness.  No edema. Neurologic:   Normal speech and language.  Motor grossly intact. Normal dorsiflexion and plantarflexion.  Good hip flexion against resistance. Quiet patellar reflexes, no clonus. No acute focal neurologic deficits are appreciated.  Skin:    Skin is warm, dry and intact. No rash noted.  No petechiae, purpura, or bullae.  ____________________________________________    LABS (pertinent positives/negatives) (all labs ordered are listed, but only abnormal results are displayed) Labs Reviewed - No data to display ____________________________________________   EKG    ____________________________________________    RADIOLOGY  MR LUMBAR SPINE WO CONTRAST  Result Date: 06/20/2021 CLINICAL DATA:  Chronic low back pain. EXAM: MRI LUMBAR SPINE WITHOUT CONTRAST TECHNIQUE: Multiplanar, multisequence MR imaging of the lumbar spine was performed. No intravenous contrast was administered. COMPARISON:  Lumbar spine x-rays dated March 22, 2021. FINDINGS: Segmentation:  Standard. Alignment:   Physiologic. Vertebrae:  No fracture, evidence of discitis, or bone lesion. Conus medullaris and cauda equina: Conus extends to the T12-L1 level. Conus and cauda equina appear normal. Paraspinal and other soft tissues: Negative. Disc levels: T12-L1 to L2-L3:  Negative. L3-L4: Small central and right paracentral disc protrusion. No stenosis. L4-L5: Large central disc protrusion. Mild bilateral facet arthropathy. Severe spinal canal stenosis. No neuroforaminal stenosis. L5-S1: Tiny central disc protrusion. Mild bilateral facet arthropathy. No stenosis. IMPRESSION: 1. Large central disc protrusion at L4-L5 with severe spinal canal stenosis. 2. Small disc protrusions at L3-L4 and L5-S1 without stenosis or impingement. Electronically Signed   By: March 24, 2021  Howell Pringle M.D.   On: 06/20/2021 09:05    ____________________________________________   PROCEDURES Procedures  ____________________________________________    CLINICAL IMPRESSION / ASSESSMENT AND PLAN / ED COURSE  Medications ordered in the ED: Medications  ketorolac (TORADOL) injection 15 mg (15 mg Intramuscular Given 06/20/21 0800)  oxyCODONE-acetaminophen (PERCOCET/ROXICET) 5-325 MG per tablet 1 tablet (1 tablet Oral Given 06/20/21 0758)  ondansetron (ZOFRAN-ODT) disintegrating tablet 8 mg (8 mg Oral Given 06/20/21 0758)    Pertinent labs & imaging results that were available during my care of the patient were reviewed by me and considered in my medical decision making (see chart for details).  Alexis Miller was evaluated in Emergency Department on 06/20/2021 for the symptoms described in the history of present illness. She was evaluated in the context of the global COVID-19 pandemic, which necessitated consideration that the patient might be at risk for infection with the SARS-CoV-2 virus that causes COVID-19. Institutional protocols and algorithms that pertain to the evaluation of patients at risk for COVID-19 are in a state of rapid change based on  information released by regulatory bodies including the CDC and federal and state organizations. These policies and algorithms were followed during the patient's care in the ED.   Patient presents with worsening severe low back pain with radiculopathy.  So far not adequately responsive to outpatient measures.  MRI obtained which shows severely herniated disc at the L4-L5 level.  She does not have symptoms of cauda equina, no evidence of infection or fracture.  No spinal cord abnormality.  Discussed with neurosurgery Dr. Marcell Barlow who will plan to see her in clinic tomorrow for further pain control measures and discussion of surgical management options and necessary steps.  She had good relief of pain with IM Toradol and Percocet in the ED and is able to stand up and walk..  We will continue to use on an outpatient basis, add prednisone taper, low-dose gabapentin, Zofran as needed, Pepcid as needed.  Return precautions discussed.  Stable for discharge.      ____________________________________________   FINAL CLINICAL IMPRESSION(S) / ED DIAGNOSES    Final diagnoses:  Lumbar disc herniation with radiculopathy     ED Discharge Orders          Ordered    oxyCODONE-acetaminophen (PERCOCET) 5-325 MG tablet  Every 6 hours PRN        06/20/21 1138    ketorolac (TORADOL) 10 MG tablet  Every 6 hours PRN        06/20/21 1138    gabapentin (NEURONTIN) 100 MG capsule        06/20/21 1138    predniSONE (STERAPRED UNI-PAK 21 TAB) 10 MG (21) TBPK tablet        06/20/21 1138    ondansetron (ZOFRAN ODT) 4 MG disintegrating tablet  Every 8 hours PRN        06/20/21 1138            Portions of this note were generated with dragon dictation software. Dictation errors may occur despite best attempts at proofreading.    Sharman Cheek, MD 06/20/21 [phone]

## 2021-06-20 NOTE — ED Triage Notes (Signed)
Pt arrives to ed via pov with c/o lower back pain that has been ongoing since may. Pt reports MRI scheduled on Monday but states unable to wait due to pain. NAD noted at this time

## 2021-06-21 ENCOUNTER — Other Ambulatory Visit: Payer: Self-pay | Admitting: Neurosurgery

## 2021-06-21 DIAGNOSIS — M5416 Radiculopathy, lumbar region: Secondary | ICD-10-CM | POA: Diagnosis not present

## 2021-06-21 DIAGNOSIS — Z6841 Body Mass Index (BMI) 40.0 and over, adult: Secondary | ICD-10-CM | POA: Diagnosis not present

## 2021-06-28 ENCOUNTER — Ambulatory Visit
Admission: RE | Admit: 2021-06-28 | Discharge: 2021-06-28 | Disposition: A | Discharge status: Home / Self Care | Payer: BLUE CROSS/BLUE SHIELD | Source: Ambulatory Visit | Attending: Neurosurgery | Admitting: Neurosurgery

## 2021-06-28 ENCOUNTER — Other Ambulatory Visit: Payer: Self-pay

## 2021-06-28 DIAGNOSIS — M4727 Other spondylosis with radiculopathy, lumbosacral region: Secondary | ICD-10-CM | POA: Diagnosis not present

## 2021-06-28 DIAGNOSIS — M48061 Spinal stenosis, lumbar region without neurogenic claudication: Secondary | ICD-10-CM | POA: Diagnosis not present

## 2021-06-28 DIAGNOSIS — M5416 Radiculopathy, lumbar region: Secondary | ICD-10-CM

## 2021-06-28 MED ORDER — IOPAMIDOL (ISOVUE-M 200) INJECTION 41%
1.0000 mL | Freq: Once | INTRAMUSCULAR | Status: AC
  Administered 2021-06-28: 1 mL via EPIDURAL

## 2021-06-28 MED ORDER — METHYLPREDNISOLONE ACETATE 40 MG/ML INJ SUSP (RADIOLOG
80.0000 mg | Freq: Once | INTRAMUSCULAR | Status: AC
  Administered 2021-06-28: 80 mg via EPIDURAL

## 2021-06-28 NOTE — Discharge Instructions (Signed)

## 2021-07-19 DIAGNOSIS — E282 Polycystic ovarian syndrome: Secondary | ICD-10-CM | POA: Diagnosis not present

## 2021-07-19 DIAGNOSIS — Z6841 Body Mass Index (BMI) 40.0 and over, adult: Secondary | ICD-10-CM | POA: Diagnosis not present

## 2021-07-19 DIAGNOSIS — J45909 Unspecified asthma, uncomplicated: Secondary | ICD-10-CM | POA: Diagnosis not present

## 2021-07-19 DIAGNOSIS — R635 Abnormal weight gain: Secondary | ICD-10-CM | POA: Diagnosis not present

## 2021-07-21 DIAGNOSIS — R0981 Nasal congestion: Secondary | ICD-10-CM | POA: Diagnosis not present

## 2021-07-21 DIAGNOSIS — J45901 Unspecified asthma with (acute) exacerbation: Secondary | ICD-10-CM | POA: Diagnosis not present

## 2021-07-21 DIAGNOSIS — J22 Unspecified acute lower respiratory infection: Secondary | ICD-10-CM | POA: Diagnosis not present

## 2021-07-21 DIAGNOSIS — Z20822 Contact with and (suspected) exposure to covid-19: Secondary | ICD-10-CM | POA: Diagnosis not present

## 2021-07-21 DIAGNOSIS — R051 Acute cough: Secondary | ICD-10-CM | POA: Diagnosis not present

## 2021-07-22 ENCOUNTER — Other Ambulatory Visit: Payer: Self-pay | Admitting: Neurosurgery

## 2021-07-22 DIAGNOSIS — M5416 Radiculopathy, lumbar region: Secondary | ICD-10-CM

## 2021-07-24 ENCOUNTER — Other Ambulatory Visit: Payer: Self-pay

## 2021-07-24 ENCOUNTER — Ambulatory Visit
Admission: RE | Admit: 2021-07-24 | Discharge: 2021-07-24 | Disposition: A | Discharge status: Home / Self Care | Payer: BLUE CROSS/BLUE SHIELD | Source: Ambulatory Visit | Attending: Neurosurgery | Admitting: Neurosurgery

## 2021-07-24 DIAGNOSIS — M47817 Spondylosis without myelopathy or radiculopathy, lumbosacral region: Secondary | ICD-10-CM | POA: Diagnosis not present

## 2021-07-24 DIAGNOSIS — M5126 Other intervertebral disc displacement, lumbar region: Secondary | ICD-10-CM | POA: Diagnosis not present

## 2021-07-24 DIAGNOSIS — M5416 Radiculopathy, lumbar region: Secondary | ICD-10-CM

## 2021-07-24 MED ORDER — METHYLPREDNISOLONE ACETATE 40 MG/ML INJ SUSP (RADIOLOG
80.0000 mg | Freq: Once | INTRAMUSCULAR | Status: AC
  Administered 2021-07-24: 80 mg via EPIDURAL

## 2021-07-24 MED ORDER — IOPAMIDOL (ISOVUE-M 200) INJECTION 41%
1.0000 mL | Freq: Once | INTRAMUSCULAR | Status: AC
  Administered 2021-07-24: 1 mL via EPIDURAL

## 2021-07-24 NOTE — Discharge Instructions (Signed)

## 2021-08-12 DIAGNOSIS — Z713 Dietary counseling and surveillance: Secondary | ICD-10-CM | POA: Diagnosis not present

## 2021-08-26 DIAGNOSIS — Z713 Dietary counseling and surveillance: Secondary | ICD-10-CM | POA: Diagnosis not present

## 2021-08-26 DIAGNOSIS — Z6841 Body Mass Index (BMI) 40.0 and over, adult: Secondary | ICD-10-CM | POA: Diagnosis not present

## 2021-08-27 ENCOUNTER — Other Ambulatory Visit: Payer: Self-pay | Admitting: Neurosurgery

## 2021-08-27 DIAGNOSIS — M5416 Radiculopathy, lumbar region: Secondary | ICD-10-CM

## 2021-08-28 ENCOUNTER — Ambulatory Visit
Admission: RE | Admit: 2021-08-28 | Discharge: 2021-08-28 | Disposition: A | Discharge status: Home / Self Care | Payer: BLUE CROSS/BLUE SHIELD | Source: Ambulatory Visit | Attending: Neurosurgery | Admitting: Neurosurgery

## 2021-08-28 DIAGNOSIS — M5116 Intervertebral disc disorders with radiculopathy, lumbar region: Secondary | ICD-10-CM | POA: Diagnosis not present

## 2021-08-28 DIAGNOSIS — M48061 Spinal stenosis, lumbar region without neurogenic claudication: Secondary | ICD-10-CM | POA: Diagnosis not present

## 2021-08-28 DIAGNOSIS — M5416 Radiculopathy, lumbar region: Secondary | ICD-10-CM

## 2021-08-28 MED ORDER — IBUPROFEN 800 MG PO TABS
800.0000 mg | ORAL_TABLET | Freq: Once | ORAL | Status: AC
  Administered 2021-08-28: 800 mg via ORAL

## 2021-08-28 MED ORDER — METHYLPREDNISOLONE ACETATE 40 MG/ML INJ SUSP (RADIOLOG
80.0000 mg | Freq: Once | INTRAMUSCULAR | Status: AC
  Administered 2021-08-28: 80 mg via EPIDURAL

## 2021-08-28 MED ORDER — ONDANSETRON HCL 4 MG/2ML IJ SOLN
4.0000 mg | Freq: Once | INTRAMUSCULAR | Status: AC
  Administered 2021-08-28: 4 mg via INTRAMUSCULAR

## 2021-08-28 MED ORDER — MEPERIDINE HCL 50 MG/ML IJ SOLN
50.0000 mg | Freq: Once | INTRAMUSCULAR | Status: AC
Start: 2021-08-28 — End: 2021-08-28
  Administered 2021-08-28: 50 mg via INTRAMUSCULAR

## 2021-08-28 MED ORDER — IOPAMIDOL (ISOVUE-M 200) INJECTION 41%
1.0000 mL | Freq: Once | INTRAMUSCULAR | Status: AC
  Administered 2021-08-28: 1 mL via EPIDURAL

## 2021-08-28 NOTE — Discharge Instructions (Signed)

## 2021-08-28 NOTE — Discharge Instr - Other Orders (Addendum)
1310: pt c/o pain 10/10 sharp, in lower back from injection. Laying on Doctor, general practice in nursing area. Dr. Mosetta Putt saw pt, aware, see MAR 1330: Relief reported, pain 5/10.

## 2021-10-01 DIAGNOSIS — F4320 Adjustment disorder, unspecified: Secondary | ICD-10-CM | POA: Diagnosis not present

## 2021-10-04 DIAGNOSIS — F4329 Adjustment disorder with other symptoms: Secondary | ICD-10-CM | POA: Diagnosis not present

## 2021-10-07 IMAGING — XA Imaging study
2 series · 2 of 2 positions shown · non-contrast
Comparison: none

CLINICAL DATA: Lumbosacral spondylosis without myelopathy with
radiculopathy. Pain in the low back and legs, right greater than
left. Central disc herniation at L4-5. An interlaminar epidural
injection was requested at L4-5, however given the large size of the
disc herniation and resultant severe spinal stenosis at this level,
it was suspected that an injection at this level may be very painful
for the patient or even intolerable. Therefore, today's injection
was performed at L5-S1, and the patient still required multiple
breaks during the injection.

[Series 1: ortho adipose · 1 of 1 slices shown (1 of 2)]
[im 1/1]
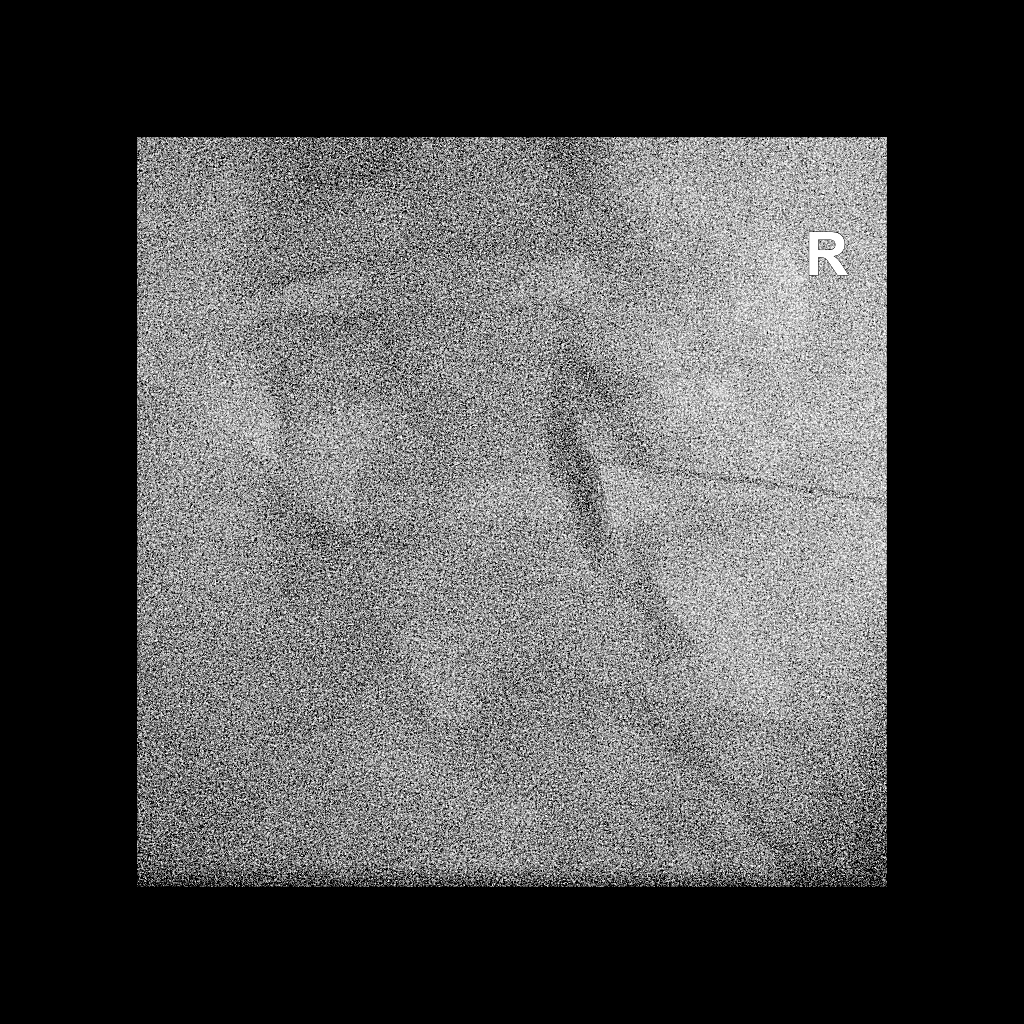

[Series 2: ortho adipose · 1 of 1 slices shown (2 of 2)]
[im 1/1]
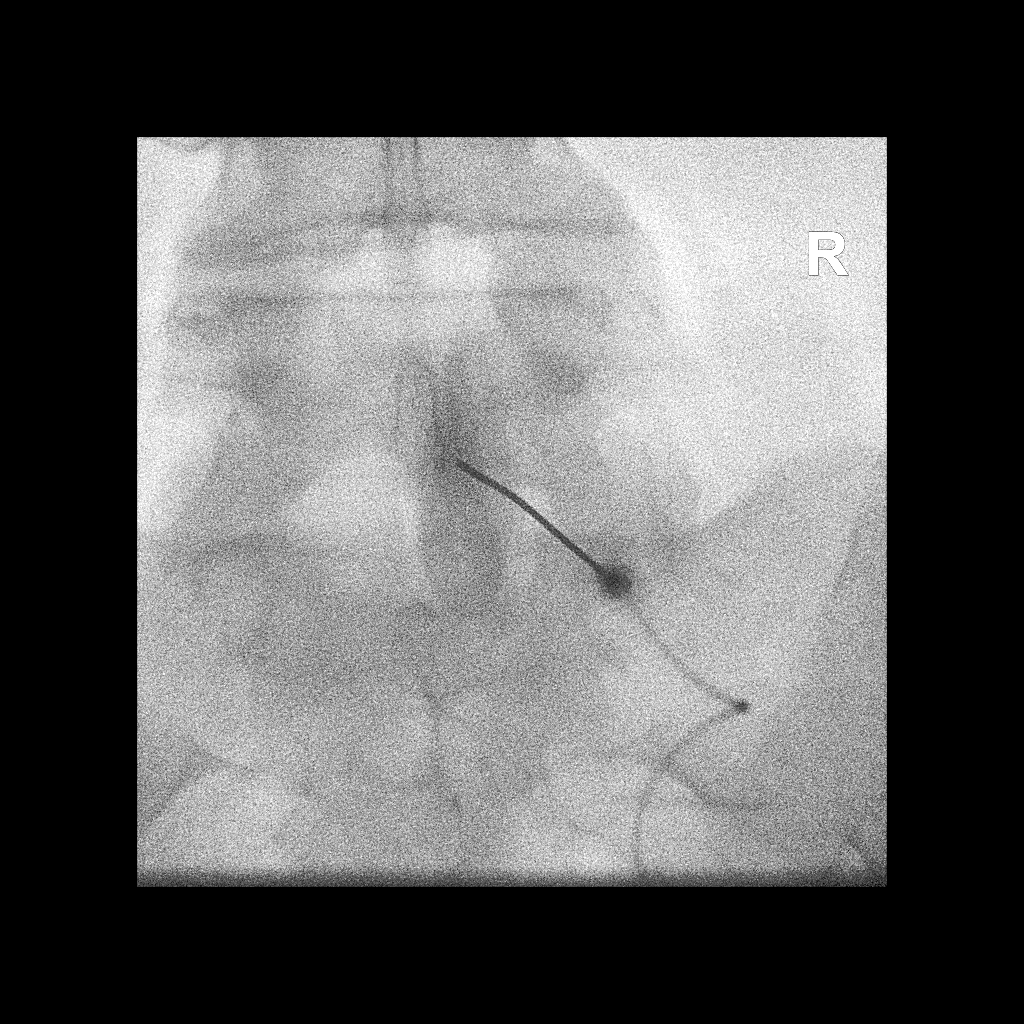

[2 of 2 positions shown; findings below may reference images not displayed]

FLUOROSCOPY TIME:  Fluoroscopy Time: 16 seconds

Radiation Exposure Index: 60.16 microGray*m^2

PROCEDURE:
The procedure, risks, benefits, and alternatives were explained to
the patient. Questions regarding the procedure were encouraged and
answered. The patient understands and consents to the procedure.

LUMBAR EPIDURAL INJECTION:

An interlaminar approach was performed on the right at L5-S1. The
overlying skin was cleansed and anesthetized. A 6 inch 20 gauge
epidural needle was advanced using loss-of-resistance technique.

DIAGNOSTIC EPIDURAL INJECTION:

Injection of Isovue-M 200 shows a good epidural pattern with spread
above and below the level of needle placement, primarily on the
right. No vascular opacification is seen.

THERAPEUTIC EPIDURAL INJECTION:

80 mg of Depo-Medrol mixed with 2.5 mL of 1% lidocaine were
instilled. The procedure was well-tolerated, and the patient was
discharged thirty minutes following the injection in good condition.

COMPLICATIONS:
None immediate
IMPRESSION: Technically successful interlaminar epidural injection on the right
at L5-S1.

## 2021-10-31 DIAGNOSIS — G588 Other specified mononeuropathies: Secondary | ICD-10-CM | POA: Diagnosis not present

## 2021-10-31 DIAGNOSIS — Z862 Personal history of diseases of the blood and blood-forming organs and certain disorders involving the immune mechanism: Secondary | ICD-10-CM | POA: Diagnosis not present

## 2021-10-31 DIAGNOSIS — Z713 Dietary counseling and surveillance: Secondary | ICD-10-CM | POA: Diagnosis not present

## 2021-10-31 DIAGNOSIS — E282 Polycystic ovarian syndrome: Secondary | ICD-10-CM | POA: Diagnosis not present

## 2021-10-31 DIAGNOSIS — K219 Gastro-esophageal reflux disease without esophagitis: Secondary | ICD-10-CM | POA: Diagnosis not present

## 2021-10-31 DIAGNOSIS — Z01818 Encounter for other preprocedural examination: Secondary | ICD-10-CM | POA: Diagnosis not present

## 2021-10-31 DIAGNOSIS — Z6841 Body Mass Index (BMI) 40.0 and over, adult: Secondary | ICD-10-CM | POA: Diagnosis not present

## 2021-10-31 DIAGNOSIS — R635 Abnormal weight gain: Secondary | ICD-10-CM | POA: Diagnosis not present

## 2021-11-02 ENCOUNTER — Encounter (HOSPITAL_COMMUNITY): Payer: Self-pay

## 2021-11-02 ENCOUNTER — Other Ambulatory Visit: Payer: Self-pay

## 2021-11-02 ENCOUNTER — Emergency Department (HOSPITAL_COMMUNITY)
Admission: EM | Admit: 2021-11-02 | Discharge: 2021-11-02 | Disposition: A | Discharge status: Home / Self Care | Payer: BC Managed Care – PPO | Attending: Emergency Medicine | Admitting: Emergency Medicine

## 2021-11-02 DIAGNOSIS — M5441 Lumbago with sciatica, right side: Secondary | ICD-10-CM | POA: Insufficient documentation

## 2021-11-02 DIAGNOSIS — M545 Low back pain, unspecified: Secondary | ICD-10-CM | POA: Diagnosis not present

## 2021-11-02 DIAGNOSIS — M5442 Lumbago with sciatica, left side: Secondary | ICD-10-CM | POA: Insufficient documentation

## 2021-11-02 DIAGNOSIS — M5126 Other intervertebral disc displacement, lumbar region: Secondary | ICD-10-CM | POA: Insufficient documentation

## 2021-11-02 IMAGING — XA Imaging study
2 series · 2 of 2 positions shown · non-contrast
Comparison: none

CLINICAL DATA: Lumbosacral spondylosis without myelopathy. Low back
and leg pain, right greater than left. Large central disc herniation
at L4-5. Mild, nonsustained improvement following the epidural
injection last month.

[Series 1: ortho adipose · 1 of 1 slices shown (1 of 2)]
[im 1/1]
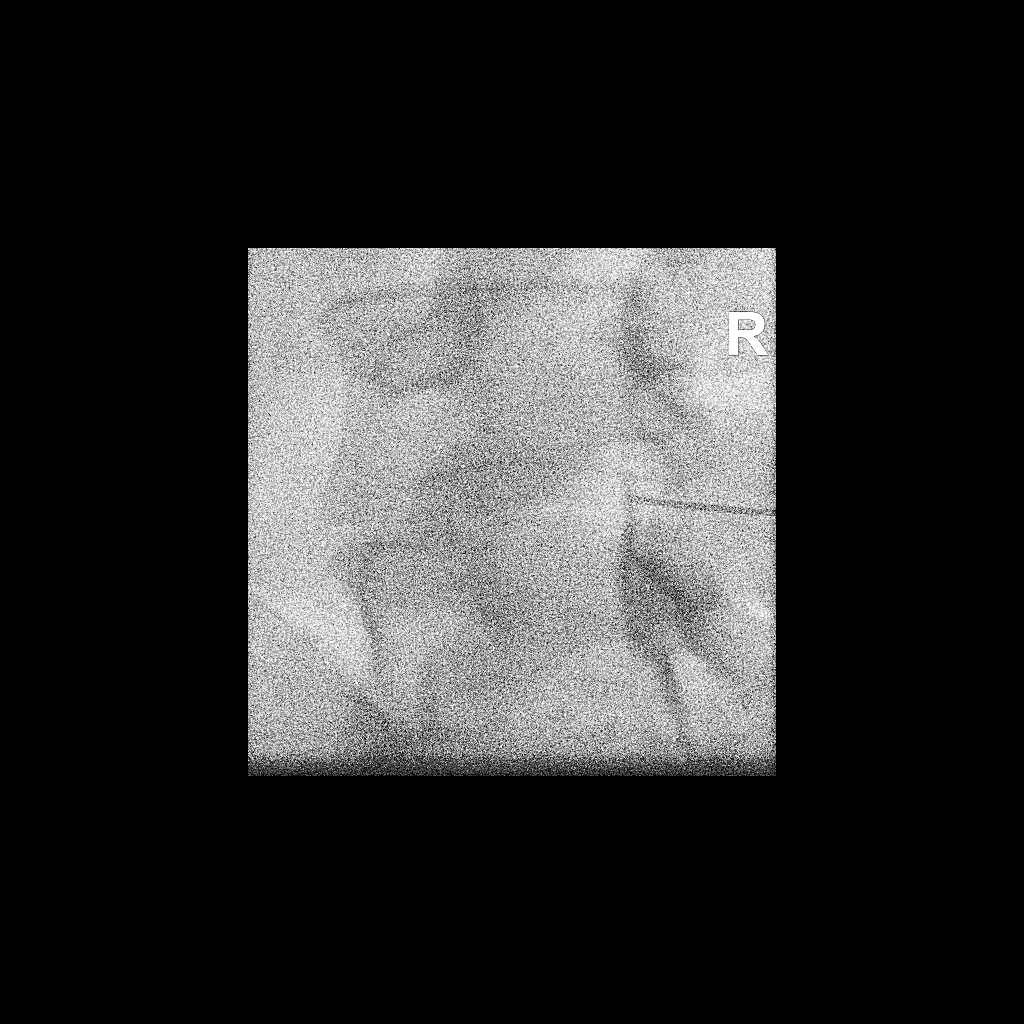

[Series 2: ortho adipose · 1 of 1 slices shown (2 of 2)]
[im 1/1]
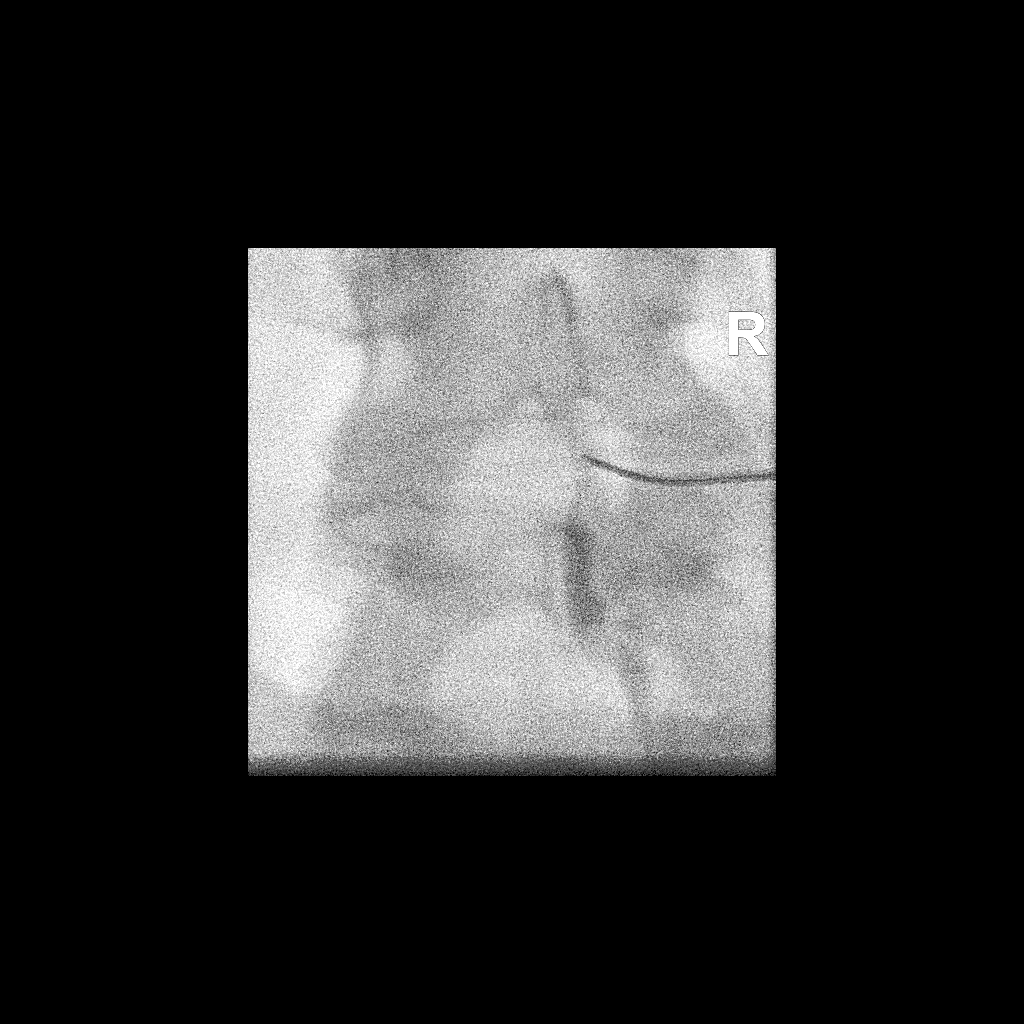

[2 of 2 positions shown; findings below may reference images not displayed]

FLUOROSCOPY TIME:  Fluoroscopy Time: 15 seconds

Radiation Exposure Index: 31.56 microGray*m^2

PROCEDURE:
The procedure, risks, benefits, and alternatives were explained to
the patient. Questions regarding the procedure were encouraged and
answered. The patient understands and consents to the procedure.

LUMBAR EPIDURAL INJECTION:

An interlaminar approach was performed on the right at L4-5. The
overlying skin was cleansed and anesthetized. A 6 inch 20 gauge
epidural needle was advanced using loss-of-resistance technique.

DIAGNOSTIC EPIDURAL INJECTION:

Injection of Isovue-M 200 shows a good epidural pattern with spread
above and below the level of needle placement, primarily on the
right. No vascular opacification is seen.

THERAPEUTIC EPIDURAL INJECTION:

80 mg of Depo-Medrol mixed with 3 mL of 1% lidocaine were instilled.
The procedure was well-tolerated, and the patient was discharged
thirty minutes following the injection in good condition.

COMPLICATIONS:
None immediate
IMPRESSION: Technically successful interlaminar epidural injection on the right
at L4-5.

## 2021-11-02 MED ORDER — OXYCODONE-ACETAMINOPHEN 5-325 MG PO TABS: 1.0000 | ORAL_TABLET | Freq: Four times a day (QID) | ORAL | 0 refills | Status: DC | PRN

## 2021-11-02 MED ORDER — OXYCODONE-ACETAMINOPHEN 5-325 MG PO TABS
1.0000 | ORAL_TABLET | Freq: Once | ORAL | Status: AC
  Administered 2021-11-02: 1 via ORAL
  Filled 2021-11-02: qty 1

## 2021-11-02 MED ORDER — PREDNISONE 10 MG PO TABS: 40.0000 mg | ORAL_TABLET | Freq: Every day | ORAL | 0 refills | Status: AC

## 2021-11-02 MED ORDER — PREDNISONE 20 MG PO TABS
60.0000 mg | ORAL_TABLET | Freq: Once | ORAL | Status: AC
  Administered 2021-11-02: 60 mg via ORAL
  Filled 2021-11-02: qty 3

## 2021-11-02 NOTE — ED Notes (Signed)
Pt and family updated of status

## 2021-11-02 NOTE — ED Notes (Signed)
Pt NAD in exam chair in hallway. A/ox4, pt states she has been having right sided lumbar pain with right leg radiation x 1 year intermittently, but today it is worse. Pt denies any injury or trauma preceding this increased pain. -incontinence, loss of function at any time.

## 2021-11-02 NOTE — ED Provider Notes (Signed)
[facility] EMERGENCY DEPARTMENT Provider Note   CSN: [id number] Arrival date & time: 11/02/21  1322     History  Chief Complaint  Patient presents with   Back Pain    Alexis Miller is a 31 y.o. female.  HPI She presents for evaluation of low back pain, which radiates to both leg and is accompanied by sensation of numbness.  She is ambulating.  She has received 3 epidural injections, and has not had relief of her discomfort.  Most recent epidural was 08/28/2021.  She is currently using ibuprofen without relief of her pain.  She is not reporting fecal or urinary incontinence.  She has been receiving care through [facility], Dr. Marcell Barlow.  She has sought an outpatient follow-up with neurosurgery in [place], and has an appointment with Dr. Danielle Dess, in March.  She feels that she cannot wait till then and requests that I call Dr. Franky Macho who is on-call for Dr. Danielle Dess.  Home Medications Prior to Admission medications   Medication Sig Start Date End Date Taking? Authorizing Provider  amoxicillin-clavulanate (AUGMENTIN) 875-125 MG tablet Take 1 tablet by mouth every 12 (twelve) hours. 10/01/20   Delton See, MD  gabapentin (NEURONTIN) 100 MG capsule Start with 100mg  three times daily by mouth for one week.  If symptoms are not improved, you may then increase the dose to 200mg  by mouth three times daily.  Remain at this dose until you follow up with primary care. 06/20/21   , MD  guaiFENesin-codeine [facility]) 100-10 MG/5ML syrup Take 5 mLs by mouth 3 (three) times daily as needed for cough. 10/16/20   [facility], NP  ketorolac (TORADOL) 10 MG tablet Take 1 tablet (10 mg total) by mouth every 6 (six) hours as needed for moderate pain. 06/20/21   Moshe Cipro, MD  meloxicam (MOBIC) 15 MG tablet TAKE 1 TABLET (15 MG TOTAL) BY MOUTH DAILY. 04/06/17   Sharman Cheek, MD  ondansetron (ZOFRAN ODT) 4 MG disintegrating tablet Take 1 tablet (4 mg  total) by mouth every 8 (eight) hours as needed for nausea or vomiting. 06/20/21   Dianne Dun, MD  predniSONE (STERAPRED UNI-PAK 21 TAB) 10 MG (21) TBPK tablet 6 tablets on day 1, then 5 tablets on day 2, then 4 tablets on day 3, then 3 tablets on day 4, then 2 tablets on day 5, then 1 tablet on day 6. 06/20/21   Sharman Cheek, MD      Allergies    Cephalosporins    Review of Systems   Review of Systems  All other systems reviewed and are negative.  Physical Exam Updated Vital Signs BP 140/80 (BP Location: Right Arm)    Pulse 88    Temp 98 F (36.7 C) (Oral)    Resp 20    LMP 11/02/2021    SpO2 95%  Physical Exam Vitals and nursing note reviewed.  Constitutional:      General: She is not in acute distress.    Appearance: She is well-developed. She is not ill-appearing, toxic-appearing or diaphoretic.  HENT:     Head: Normocephalic and atraumatic.     Right Ear: External ear normal.     Left Ear: External ear normal.  Eyes:     Conjunctiva/sclera: Conjunctivae normal.     Pupils: Pupils are equal, round, and reactive to light.  Neck:     Trachea: Phonation normal.  Cardiovascular:     Rate and Rhythm: Normal rate.  Heart sounds: Normal heart sounds.  Pulmonary:     Effort: Pulmonary effort is normal.  Abdominal:     General: There is no distension.  Musculoskeletal:        General: Normal range of motion.     Cervical back: Normal range of motion and neck supple.  Skin:    General: Skin is warm and dry.  Neurological:     Mental Status: She is alert and oriented to person, place, and time.     Cranial Nerves: No cranial nerve deficit.     Sensory: No sensory deficit.     Motor: No abnormal muscle tone.     Coordination: Coordination normal.  Psychiatric:        Mood and Affect: Mood normal.        Behavior: Behavior normal.        Thought Content: Thought content normal.        Judgment: Judgment normal.    ED Results / Procedures / Treatments    Labs (all labs ordered are listed, but only abnormal results are displayed) Labs Reviewed - No data to display  EKG None  Radiology No results found.  Procedures Procedures    Medications Ordered in ED Medications  oxyCODONE-acetaminophen (PERCOCET/ROXICET) 5-325 MG per tablet 1 tablet (has no administration in time range)  predniSONE (DELTASONE) tablet 60 mg (has no administration in time range)    ED Course/ Medical Decision Making/ A&P Clinical Course as of 11/02/21 2249  Sat Nov 02, 2021  1834 Still awaiting neurosurgeon to arrive and see the patient.  Will give second dose of Percocet. [EW]    Clinical Course User Index [EW] Mancel Bale, MD                           Medical Decision Making Amount and/or Complexity of Data Reviewed External Data Reviewed: notes.    Details: MRI lumbar spine done September 2022 indicates large L4-L5 disc herniation with severe canal stenosis.  Smaller disc herniations above that level in the lumbar spine, without impingement findings. Discussion of management or test interpretation with external provider(s): I discussed the case with neurosurgeon, Dr. Franky Macho, including her MRI results and her request to be evaluated.  He stated that he will come see her, when he could, this afternoon.  Risk Prescription drug management. Risk Details: Anticipate discharge after she is seen by the neurosurgeon.  She does not have criteria for requirement of advanced imaging at this time.  She does not require hospitalization.  Will discharge on Percocet and prednisone for optimization of outpatient care.           Final Clinical Impression(s) / ED Diagnoses Final diagnoses:  Midline low back pain with bilateral sciatica, unspecified chronicity  Lumbar disc herniation    Rx / DC Orders ED Discharge Orders     None         Mancel Bale, MD 11/02/21 2250

## 2021-11-02 NOTE — ED Triage Notes (Signed)
Patient arrives with complaints of worsening back chronic back pain x1 year. Patient reports that the pain has began radiating down both her legs (more in the right leg). Patient has been evaluated in the past for the same and she was given "shots". She also had a MRI previously.   Ambulatory in triage.

## 2021-11-02 NOTE — ED Provider Notes (Signed)
Care handoff from Dr. Eulis Foster at shift change. Please see his note for further information.  Briefly: Patient with low back pain radiating to both legs with associated numbness. Had an MRI in September and has follow-up with neurosurgery in March, however states that she doesn't feel that she can wait that long due to severity of symptoms  Plan: Neurosurgery Dr. Christella Noa has been consulted, states that he is currently in the OR and will see the patient when he is done. Pain managed in the ER  Dr. Christella Noa has seen the patient, states that she can be managed adequately in the outpatient. His office will call to schedule an appointment ASAP for management. Plan to send patient home with pain meds and oral steroids. Discussed this with patient, all questions answered. Will send in prescription pain meds. She is amenable with plan, educated on red flag symptoms that would prompt immediate return. Discharged in stable condition.   This is a shared visit with supervising physician Dr. Eulis Foster who has independently evaluated patient & provided guidance in evaluation/management/disposition, in agreement with care     Alexis Miller 11/02/21 2228    Daleen Bo, MD 11/02/21 2251

## 2021-11-05 ENCOUNTER — Other Ambulatory Visit: Payer: Self-pay | Admitting: Neurological Surgery

## 2021-11-05 DIAGNOSIS — M5116 Intervertebral disc disorders with radiculopathy, lumbar region: Secondary | ICD-10-CM | POA: Diagnosis not present

## 2021-11-05 DIAGNOSIS — M5126 Other intervertebral disc displacement, lumbar region: Secondary | ICD-10-CM | POA: Diagnosis not present

## 2021-11-06 NOTE — Pre-Procedure Instructions (Signed)
Surgical Instructions    Your procedure is scheduled on Monday, November 11, 2021 at 11:23 AM.  Report to [facility] Main Entrance "A" at 9:20 A.M., then check in with the Admitting office.  Call this number if you have problems the morning of surgery:  [phone]   If you have any questions prior to your surgery date call [phone]: Open Monday-Friday 8am-4pm    Remember:  Do not eat after midnight the night before your surgery  You may drink clear liquids until 8:20 AM the morning of your surgery.   Clear liquids allowed are: Water, Non-Citrus Juices (without pulp), Carbonated Beverages, Clear Tea, Black Coffee Only (NO MILK, CREAM OR POWDERED CREAMER of any kind), and Gatorade.    Take these medicines the morning of surgery with A SIP OF WATER:  JUNEL FE 1/20  loratadine (CLARITIN) pantoprazole (PROTONIX) predniSONE (DELTASONE)  IF NEEDED: acetaminophen (TYLENOL) albuterol (VENTOLIN HFA) - Bring with you the morning of surgery.   As of today, STOP taking any Aspirin (unless otherwise instructed by your surgeon) Aleve, Naproxen, , Motrin, ibuprofen (ADVIL), Goody's, BC's, all herbal medications, fish oil, and all vitamins.                     Do NOT Smoke (Tobacco/Vaping) for 24 hours prior to your procedure.  If you use a CPAP at night, you may bring your mask/headgear for your overnight stay.   Contacts, glasses, piercing's, hearing aid's, dentures or partials may not be worn into surgery, please bring cases for these belongings.    For patients admitted to the hospital, discharge time will be determined by your treatment team.   Patients discharged the day of surgery will not be allowed to drive home, and someone needs to stay with them for 24 hours.  NO VISITORS WILL BE ALLOWED IN PRE-OP WHERE PATIENTS ARE PREPPED FOR SURGERY.  ONLY 1 SUPPORT PERSON MAY BE PRESENT IN THE WAITING ROOM WHILE YOU ARE IN SURGERY.  IF YOU ARE TO BE ADMITTED, ONCE YOU ARE IN YOUR ROOM  YOU WILL BE ALLOWED TWO (2) VISITORS. (1) VISITOR MAY STAY OVERNIGHT BUT MUST ARRIVE TO THE ROOM BY 8pm.  Minor children may have two parents present. Special consideration for safety and communication needs will be reviewed on a case by case basis.   Special instructions:   [place]- Preparing For Surgery  Before surgery, you can play an important role. Because skin is not sterile, your skin needs to be as free of germs as possible. You can reduce the number of germs on your skin by washing with CHG (chlorahexidine gluconate) Soap before surgery.  CHG is an antiseptic cleaner which kills germs and bonds with the skin to continue killing germs even after washing.    Oral Hygiene is also important to reduce your risk of infection.  Remember - BRUSH YOUR TEETH THE MORNING OF SURGERY WITH YOUR REGULAR TOOTHPASTE  Please do not use if you have an allergy to CHG or antibacterial soaps. If your skin becomes reddened/irritated stop using the CHG.  Do not shave (including legs and underarms) for at least 48 hours prior to first CHG shower. It is OK to shave your face.  Please follow these instructions carefully.   Shower the NIGHT BEFORE SURGERY and the MORNING OF SURGERY  If you chose to wash your hair, wash your hair first as usual with your normal shampoo.  After you shampoo, rinse your hair and body thoroughly to remove  the shampoo.  Use CHG Soap as you would any other liquid soap. You can apply CHG directly to the skin and wash gently with a scrungie or a clean washcloth.   Apply the CHG Soap to your body ONLY FROM THE NECK DOWN.  Do not use on open wounds or open sores. Avoid contact with your eyes, ears, mouth and genitals (private parts). Wash Face and genitals (private parts)  with your normal soap.   Wash thoroughly, paying special attention to the area where your surgery will be performed.  Thoroughly rinse your body with warm water from the neck down.  DO NOT shower/wash with your  normal soap after using and rinsing off the CHG Soap.  Pat yourself dry with a CLEAN TOWEL.  Wear CLEAN PAJAMAS to bed the night before surgery  Place CLEAN SHEETS on your bed the night before your surgery  DO NOT SLEEP WITH PETS.   Day of Surgery: Shower with CHG soap. Do not wear jewelry, make up, nail polish, gel polish, artificial nails, or any other type of covering on natural nails including finger and toenails. If patients have artificial nails, gel coating, etc. that need to be removed by a nail salon please have this removed prior to surgery. Surgery may need to be canceled/delayed if the surgeon/anesthesiologist feels like the patient is unable to be adequately monitored. Do not wear lotions, powders, perfumes, or deodorant. Do not shave 48 hours prior to surgery. Do not bring valuables to the hospital. [facility] is not responsible for any belongings or valuables. Wear Clean/Comfortable clothing the morning of surgery Remember to brush your teeth WITH YOUR REGULAR TOOTHPASTE.   Please read over the following fact sheets that you were given.   3 days prior to your procedure or After your COVID test   You are not required to quarantine however you are required to wear a well-fitting mask when you are out and around people not in your household. If your mask becomes wet or soiled, replace with a new one.   Wash your hands often with soap and water for 20 seconds or clean your hands with an alcohol-based hand sanitizer that contains at least 60% alcohol.   Do not share personal items.   Notify your provider:  o if you are in close contact with someone who has COVID  o or if you develop a fever of 100.4 or greater, sneezing, cough, sore throat, shortness of breath or body aches.

## 2021-11-07 ENCOUNTER — Encounter (HOSPITAL_COMMUNITY): Payer: Self-pay

## 2021-11-07 ENCOUNTER — Encounter (HOSPITAL_COMMUNITY)
Admission: RE | Admit: 2021-11-07 | Discharge: 2021-11-07 | Disposition: A | Discharge status: Home / Self Care | Payer: BC Managed Care – PPO | Source: Ambulatory Visit | Attending: Neurological Surgery | Admitting: Neurological Surgery

## 2021-11-07 ENCOUNTER — Other Ambulatory Visit: Payer: Self-pay

## 2021-11-07 VITALS — BP 130/70 | HR 84 | Temp 98.1°F | Resp 19 | Ht 67.0 in | Wt 346.0 lb

## 2021-11-07 DIAGNOSIS — Z20822 Contact with and (suspected) exposure to covid-19: Secondary | ICD-10-CM | POA: Diagnosis not present

## 2021-11-07 DIAGNOSIS — Z01818 Encounter for other preprocedural examination: Secondary | ICD-10-CM

## 2021-11-07 DIAGNOSIS — Z01812 Encounter for preprocedural laboratory examination: Secondary | ICD-10-CM | POA: Insufficient documentation

## 2021-11-07 HISTORY — DX: Polycystic ovarian syndrome: E28.2

## 2021-11-07 HISTORY — DX: Family history of other specified conditions: Z84.89

## 2021-11-07 LAB — SURGICAL PCR SCREEN
MRSA, PCR: NEGATIVE
Staphylococcus aureus: NEGATIVE

## 2021-11-07 NOTE — Progress Notes (Signed)
PCP - Burnell Blanks MD Cardiologist - Denies  Chest x-ray - Not indicated EKG - Not indicated  Sleep Study - No denies  DM - Denies  ERAS Protcol - Yes  COVID TEST- 11/07/21  Anesthesia review: No  Patient denies shortness of breath, fever, cough and chest pain at PAT appointment   All instructions explained to the patient, with a verbal understanding of the material. Patient agrees to go over the instructions while at home for a better understanding. Patient also instructed to wear a mask while in public after being tested for COVID-19. The opportunity to ask questions was provided.

## 2021-11-08 LAB — SARS CORONAVIRUS 2 (TAT 6-24 HRS): SARS Coronavirus 2: NEGATIVE

## 2021-11-08 MED ORDER — VANCOMYCIN HCL 1500 MG/300ML IV SOLN
1500.0000 mg | INTRAVENOUS | Status: AC
  Administered 2021-11-11: 1500 mg via INTRAVENOUS
  Filled 2021-11-08: qty 300

## 2021-11-11 ENCOUNTER — Other Ambulatory Visit: Payer: Self-pay

## 2021-11-11 ENCOUNTER — Observation Stay (HOSPITAL_COMMUNITY)
Admission: RE | Admit: 2021-11-11 | Discharge: 2021-11-11 | Disposition: A | Discharge status: Home / Self Care | Payer: BC Managed Care – PPO | Attending: Neurological Surgery | Admitting: Neurological Surgery

## 2021-11-11 ENCOUNTER — Ambulatory Visit (HOSPITAL_COMMUNITY): Payer: BC Managed Care – PPO | Admitting: Certified Registered"

## 2021-11-11 ENCOUNTER — Encounter (HOSPITAL_COMMUNITY)
Admission: RE | Disposition: A | Discharge status: Home / Self Care | Payer: Self-pay | Source: Home / Self Care | Attending: Neurological Surgery

## 2021-11-11 ENCOUNTER — Encounter (HOSPITAL_COMMUNITY): Payer: Self-pay | Admitting: Neurological Surgery

## 2021-11-11 ENCOUNTER — Ambulatory Visit (HOSPITAL_COMMUNITY): Payer: BC Managed Care – PPO

## 2021-11-11 DIAGNOSIS — Z419 Encounter for procedure for purposes other than remedying health state, unspecified: Secondary | ICD-10-CM

## 2021-11-11 DIAGNOSIS — Z6841 Body Mass Index (BMI) 40.0 and over, adult: Secondary | ICD-10-CM | POA: Diagnosis not present

## 2021-11-11 DIAGNOSIS — M5116 Intervertebral disc disorders with radiculopathy, lumbar region: Secondary | ICD-10-CM | POA: Diagnosis not present

## 2021-11-11 DIAGNOSIS — E282 Polycystic ovarian syndrome: Secondary | ICD-10-CM | POA: Diagnosis not present

## 2021-11-11 DIAGNOSIS — M5126 Other intervertebral disc displacement, lumbar region: Secondary | ICD-10-CM | POA: Diagnosis present

## 2021-11-11 HISTORY — PX: LUMBAR LAMINECTOMY/DECOMPRESSION MICRODISCECTOMY: SHX5026

## 2021-11-11 LAB — POCT PREGNANCY, URINE: Preg Test, Ur: NEGATIVE

## 2021-11-11 SURGERY — LUMBAR LAMINECTOMY/DECOMPRESSION MICRODISCECTOMY 1 LEVEL
Anesthesia: General | Site: Back | Laterality: Bilateral

## 2021-11-11 MED ORDER — LIDOCAINE-EPINEPHRINE 1 %-1:100000 IJ SOLN
INTRAMUSCULAR | Status: DC | PRN
  Administered 2021-11-11: 5 mL

## 2021-11-11 MED ORDER — METFORMIN HCL ER 750 MG PO TB24
750.0000 mg | ORAL_TABLET | Freq: Two times a day (BID) | ORAL | Status: DC
  Filled 2021-11-11 (×2): qty 1

## 2021-11-11 MED ORDER — MIDAZOLAM HCL 2 MG/2ML IJ SOLN
INTRAMUSCULAR | Status: AC
  Filled 2021-11-11: qty 2

## 2021-11-11 MED ORDER — 0.9 % SODIUM CHLORIDE (POUR BTL) OPTIME
TOPICAL | Status: DC | PRN
  Administered 2021-11-11: 1000 mL

## 2021-11-11 MED ORDER — PROPOFOL 10 MG/ML IV BOLUS
INTRAVENOUS | Status: AC
  Filled 2021-11-11: qty 20

## 2021-11-11 MED ORDER — OXYCODONE HCL 5 MG/5ML PO SOLN
ORAL | Status: AC
  Filled 2021-11-11: qty 5

## 2021-11-11 MED ORDER — BUPIVACAINE HCL (PF) 0.5 % IJ SOLN
INTRAMUSCULAR | Status: DC | PRN
  Administered 2021-11-11: 25 mL
  Administered 2021-11-11: 5 mL

## 2021-11-11 MED ORDER — OXYCODONE HCL 5 MG PO TABS: 5.0000 mg | ORAL_TABLET | Freq: Once | ORAL | Status: AC | PRN

## 2021-11-11 MED ORDER — ACETAMINOPHEN 10 MG/ML IV SOLN: 1000.0000 mg | Freq: Once | INTRAVENOUS | Status: DC | PRN

## 2021-11-11 MED ORDER — ACETAMINOPHEN 650 MG RE SUPP: 650.0000 mg | RECTAL | Status: DC | PRN

## 2021-11-11 MED ORDER — BUPIVACAINE HCL (PF) 0.5 % IJ SOLN
INTRAMUSCULAR | Status: AC
  Filled 2021-11-11: qty 30

## 2021-11-11 MED ORDER — ONDANSETRON HCL 4 MG PO TABS: 4.0000 mg | ORAL_TABLET | Freq: Four times a day (QID) | ORAL | Status: DC | PRN

## 2021-11-11 MED ORDER — DOCUSATE SODIUM 100 MG PO CAPS
100.0000 mg | ORAL_CAPSULE | Freq: Two times a day (BID) | ORAL | Status: DC
  Administered 2021-11-11: 100 mg via ORAL
  Filled 2021-11-11: qty 1

## 2021-11-11 MED ORDER — ACETAMINOPHEN 160 MG/5ML PO SOLN: 1000.0000 mg | Freq: Once | ORAL | Status: DC | PRN

## 2021-11-11 MED ORDER — ONDANSETRON HCL 4 MG/2ML IJ SOLN
INTRAMUSCULAR | Status: AC
  Filled 2021-11-11: qty 4

## 2021-11-11 MED ORDER — METHOCARBAMOL 1000 MG/10ML IJ SOLN
500.0000 mg | Freq: Four times a day (QID) | INTRAVENOUS | Status: DC | PRN
  Filled 2021-11-11: qty 5

## 2021-11-11 MED ORDER — MIDAZOLAM HCL 2 MG/2ML IJ SOLN
INTRAMUSCULAR | Status: DC | PRN
Start: 2021-11-11 — End: 2021-11-11
  Administered 2021-11-11: 2 mg via INTRAVENOUS

## 2021-11-11 MED ORDER — HEMOSTATIC AGENTS (NO CHARGE) OPTIME
TOPICAL | Status: DC | PRN
  Administered 2021-11-11: 1 via TOPICAL

## 2021-11-11 MED ORDER — DEXAMETHASONE SODIUM PHOSPHATE 10 MG/ML IJ SOLN
INTRAMUSCULAR | Status: AC
  Filled 2021-11-11: qty 2

## 2021-11-11 MED ORDER — ACETAMINOPHEN 325 MG PO TABS: 650.0000 mg | ORAL_TABLET | ORAL | Status: DC | PRN

## 2021-11-11 MED ORDER — SODIUM CHLORIDE 0.9% FLUSH: 3.0000 mL | INTRAVENOUS | Status: DC | PRN

## 2021-11-11 MED ORDER — FLEET ENEMA 7-19 GM/118ML RE ENEM: 1.0000 | ENEMA | Freq: Once | RECTAL | Status: DC | PRN

## 2021-11-11 MED ORDER — LACTATED RINGERS IV SOLN: INTRAVENOUS | Status: DC

## 2021-11-11 MED ORDER — DEXAMETHASONE SODIUM PHOSPHATE 10 MG/ML IJ SOLN
INTRAMUSCULAR | Status: DC | PRN
Start: 2021-11-11 — End: 2021-11-11
  Administered 2021-11-11: 10 mg via INTRAVENOUS

## 2021-11-11 MED ORDER — MORPHINE SULFATE (PF) 2 MG/ML IV SOLN: 2.0000 mg | INTRAVENOUS | Status: DC | PRN

## 2021-11-11 MED ORDER — MENTHOL 3 MG MT LOZG: 1.0000 | LOZENGE | OROMUCOSAL | Status: DC | PRN

## 2021-11-11 MED ORDER — FENTANYL CITRATE (PF) 250 MCG/5ML IJ SOLN
INTRAMUSCULAR | Status: AC
  Filled 2021-11-11: qty 5

## 2021-11-11 MED ORDER — CHLORHEXIDINE GLUCONATE 0.12 % MT SOLN
OROMUCOSAL | Status: AC
  Filled 2021-11-11: qty 15

## 2021-11-11 MED ORDER — CHLORHEXIDINE GLUCONATE 0.12 % MT SOLN
15.0000 mL | Freq: Once | OROMUCOSAL | Status: AC
  Administered 2021-11-11: 15 mL via OROMUCOSAL

## 2021-11-11 MED ORDER — ALUM & MAG HYDROXIDE-SIMETH 200-200-20 MG/5ML PO SUSP
30.0000 mL | Freq: Four times a day (QID) | ORAL | Status: DC | PRN
Start: 2021-11-11 — End: 2021-11-12

## 2021-11-11 MED ORDER — POLYETHYLENE GLYCOL 3350 17 G PO PACK: 17.0000 g | PACK | Freq: Every day | ORAL | Status: DC | PRN

## 2021-11-11 MED ORDER — LIDOCAINE 2% (20 MG/ML) 5 ML SYRINGE
INTRAMUSCULAR | Status: AC
  Filled 2021-11-11: qty 10

## 2021-11-11 MED ORDER — ROCURONIUM BROMIDE 10 MG/ML (PF) SYRINGE
PREFILLED_SYRINGE | INTRAVENOUS | Status: AC
  Filled 2021-11-11: qty 20

## 2021-11-11 MED ORDER — ACETAMINOPHEN 10 MG/ML IV SOLN
INTRAVENOUS | Status: DC | PRN
  Administered 2021-11-11: 1000 mg via INTRAVENOUS

## 2021-11-11 MED ORDER — PHENYLEPHRINE 40 MCG/ML (10ML) SYRINGE FOR IV PUSH (FOR BLOOD PRESSURE SUPPORT)
PREFILLED_SYRINGE | INTRAVENOUS | Status: AC
  Filled 2021-11-11: qty 20

## 2021-11-11 MED ORDER — FENTANYL CITRATE (PF) 100 MCG/2ML IJ SOLN: 25.0000 ug | INTRAMUSCULAR | Status: DC | PRN

## 2021-11-11 MED ORDER — OXYCODONE HCL 5 MG/5ML PO SOLN
5.0000 mg | Freq: Once | ORAL | Status: AC | PRN
  Administered 2021-11-11: 5 mg via ORAL

## 2021-11-11 MED ORDER — ONDANSETRON HCL 4 MG/2ML IJ SOLN
INTRAMUSCULAR | Status: DC | PRN
Start: 2021-11-11 — End: 2021-11-11
  Administered 2021-11-11: 4 mg via INTRAVENOUS

## 2021-11-11 MED ORDER — METHOCARBAMOL 500 MG PO TABS
500.0000 mg | ORAL_TABLET | Freq: Four times a day (QID) | ORAL | Status: DC | PRN
  Administered 2021-11-11: 500 mg via ORAL
  Filled 2021-11-11: qty 1

## 2021-11-11 MED ORDER — SODIUM CHLORIDE 0.9% FLUSH
3.0000 mL | Freq: Two times a day (BID) | INTRAVENOUS | Status: DC
  Administered 2021-11-11: 3 mL via INTRAVENOUS

## 2021-11-11 MED ORDER — THROMBIN 5000 UNITS EX SOLR
CUTANEOUS | Status: AC
  Filled 2021-11-11: qty 5000

## 2021-11-11 MED ORDER — PHENOL 1.4 % MT LIQD: 1.0000 | OROMUCOSAL | Status: DC | PRN

## 2021-11-11 MED ORDER — THROMBIN 5000 UNITS EX SOLR
CUTANEOUS | Status: DC | PRN
  Administered 2021-11-11 (×2): 5000 [IU] via TOPICAL

## 2021-11-11 MED ORDER — OXYCODONE-ACETAMINOPHEN 5-325 MG PO TABS: 1.0000 | ORAL_TABLET | Freq: Four times a day (QID) | ORAL | 0 refills | Status: DC | PRN

## 2021-11-11 MED ORDER — METHOCARBAMOL 500 MG PO TABS: 500.0000 mg | ORAL_TABLET | Freq: Four times a day (QID) | ORAL | 1 refills | Status: DC | PRN

## 2021-11-11 MED ORDER — ALBUTEROL SULFATE HFA 108 (90 BASE) MCG/ACT IN AERS: 2.0000 | INHALATION_SPRAY | Freq: Four times a day (QID) | RESPIRATORY_TRACT | Status: DC | PRN

## 2021-11-11 MED ORDER — SUGAMMADEX SODIUM 200 MG/2ML IV SOLN
INTRAVENOUS | Status: DC | PRN
  Administered 2021-11-11: 200 mg via INTRAVENOUS
  Administered 2021-11-11: 100 mg via INTRAVENOUS

## 2021-11-11 MED ORDER — ORAL CARE MOUTH RINSE: 15.0000 mL | Freq: Once | OROMUCOSAL | Status: AC

## 2021-11-11 MED ORDER — SODIUM CHLORIDE 0.9 % IV SOLN: 250.0000 mL | INTRAVENOUS | Status: DC

## 2021-11-11 MED ORDER — PROPOFOL 10 MG/ML IV BOLUS
INTRAVENOUS | Status: DC | PRN
Start: 2021-11-11 — End: 2021-11-11
  Administered 2021-11-11: 200 mg via INTRAVENOUS

## 2021-11-11 MED ORDER — OXYCODONE-ACETAMINOPHEN 5-325 MG PO TABS
1.0000 | ORAL_TABLET | Freq: Four times a day (QID) | ORAL | Status: DC | PRN
  Administered 2021-11-11: 1 via ORAL
  Filled 2021-11-11: qty 1

## 2021-11-11 MED ORDER — SENNA 8.6 MG PO TABS
1.0000 | ORAL_TABLET | Freq: Two times a day (BID) | ORAL | Status: DC
  Administered 2021-11-11: 8.6 mg via ORAL
  Filled 2021-11-11: qty 1

## 2021-11-11 MED ORDER — CHLORHEXIDINE GLUCONATE CLOTH 2 % EX PADS: 6.0000 | MEDICATED_PAD | Freq: Once | CUTANEOUS | Status: DC

## 2021-11-11 MED ORDER — ALBUTEROL SULFATE (2.5 MG/3ML) 0.083% IN NEBU: 2.5000 mg | INHALATION_SOLUTION | Freq: Four times a day (QID) | RESPIRATORY_TRACT | Status: DC | PRN

## 2021-11-11 MED ORDER — BISACODYL 10 MG RE SUPP: 10.0000 mg | Freq: Every day | RECTAL | Status: DC | PRN

## 2021-11-11 MED ORDER — ACETAMINOPHEN 10 MG/ML IV SOLN
INTRAVENOUS | Status: AC
  Filled 2021-11-11: qty 100

## 2021-11-11 MED ORDER — ONDANSETRON HCL 4 MG/2ML IJ SOLN: 4.0000 mg | Freq: Four times a day (QID) | INTRAMUSCULAR | Status: DC | PRN

## 2021-11-11 MED ORDER — THROMBIN 5000 UNITS EX SOLR
CUTANEOUS | Status: AC
  Filled 2021-11-11: qty 10000

## 2021-11-11 MED ORDER — ACETAMINOPHEN 500 MG PO TABS: 500.0000 mg | ORAL_TABLET | Freq: Four times a day (QID) | ORAL | Status: DC | PRN

## 2021-11-11 MED ORDER — EPHEDRINE SULFATE-NACL 50-0.9 MG/10ML-% IV SOSY
PREFILLED_SYRINGE | INTRAVENOUS | Status: DC | PRN
Start: 2021-11-11 — End: 2021-11-11
  Administered 2021-11-11: 10 mg via INTRAVENOUS

## 2021-11-11 MED ORDER — LIDOCAINE-EPINEPHRINE 1 %-1:100000 IJ SOLN
INTRAMUSCULAR | Status: AC
  Filled 2021-11-11: qty 1

## 2021-11-11 MED ORDER — PANTOPRAZOLE SODIUM 20 MG PO TBEC
20.0000 mg | DELAYED_RELEASE_TABLET | Freq: Every day | ORAL | Status: DC
Start: 2021-11-11 — End: 2021-11-12
  Filled 2021-11-11: qty 1

## 2021-11-11 MED ORDER — ROCURONIUM BROMIDE 10 MG/ML (PF) SYRINGE
PREFILLED_SYRINGE | INTRAVENOUS | Status: DC | PRN
Start: 2021-11-11 — End: 2021-11-11
  Administered 2021-11-11: 100 mg via INTRAVENOUS

## 2021-11-11 MED ORDER — LORATADINE 10 MG PO TABS
10.0000 mg | ORAL_TABLET | Freq: Every day | ORAL | Status: DC
Start: 2021-11-11 — End: 2021-11-12
  Administered 2021-11-11: 10 mg via ORAL
  Filled 2021-11-11: qty 1

## 2021-11-11 MED ORDER — FENTANYL CITRATE (PF) 250 MCG/5ML IJ SOLN
INTRAMUSCULAR | Status: DC | PRN
  Administered 2021-11-11: 100 ug via INTRAVENOUS
  Administered 2021-11-11 (×2): 50 ug via INTRAVENOUS
  Administered 2021-11-11: 25 ug via INTRAVENOUS
  Administered 2021-11-11 (×3): 50 ug via INTRAVENOUS

## 2021-11-11 MED ORDER — ACETAMINOPHEN 500 MG PO TABS: 1000.0000 mg | ORAL_TABLET | Freq: Once | ORAL | Status: DC | PRN

## 2021-11-11 MED ORDER — THROMBIN 5000 UNITS EX SOLR
OROMUCOSAL | Status: DC | PRN
  Administered 2021-11-11: 5 mL via TOPICAL

## 2021-11-11 MED ORDER — KETOROLAC TROMETHAMINE 30 MG/ML IJ SOLN
INTRAMUSCULAR | Status: DC | PRN
  Administered 2021-11-11: 30 mg via INTRAVENOUS

## 2021-11-11 MED ORDER — KETOROLAC TROMETHAMINE 15 MG/ML IJ SOLN
15.0000 mg | Freq: Four times a day (QID) | INTRAMUSCULAR | Status: DC
  Administered 2021-11-11: 15 mg via INTRAVENOUS
  Filled 2021-11-11: qty 1

## 2021-11-11 MED ORDER — LIDOCAINE 2% (20 MG/ML) 5 ML SYRINGE
INTRAMUSCULAR | Status: DC | PRN
Start: 2021-11-11 — End: 2021-11-11
  Administered 2021-11-11: 60 mg via INTRAVENOUS

## 2021-11-11 SURGICAL SUPPLY — 50 items
BAG COUNTER SPONGE SURGICOUNT (BAG) ×3 IMPLANT
BAND RUBBER #18 3X1/16 STRL (MISCELLANEOUS) ×2 IMPLANT
BLADE CLIPPER SURG (BLADE) IMPLANT
BUR ACORN 6.0 (BURR) IMPLANT
BUR MATCHSTICK NEURO 3.0 LAGG (BURR) ×2 IMPLANT
CANISTER SUCT 3000ML PPV (MISCELLANEOUS) ×2 IMPLANT
DECANTER SPIKE VIAL GLASS SM (MISCELLANEOUS) ×1 IMPLANT
DERMABOND ADVANCED (GAUZE/BANDAGES/DRESSINGS) ×1
DERMABOND ADVANCED .7 DNX12 (GAUZE/BANDAGES/DRESSINGS) ×1 IMPLANT
DEVICE DISSECT PLASMABLAD 3.0S (MISCELLANEOUS) ×1 IMPLANT
DRAPE HALF SHEET 40X57 (DRAPES) IMPLANT
DRAPE LAPAROTOMY T 102X78X121 (DRAPES) ×2 IMPLANT
DRAPE MICROSCOPE LEICA (MISCELLANEOUS) ×1 IMPLANT
DRSG OPSITE POSTOP 4X6 (GAUZE/BANDAGES/DRESSINGS) ×1 IMPLANT
DURAPREP 26ML APPLICATOR (WOUND CARE) ×2 IMPLANT
ELECT REM PT RETURN 9FT ADLT (ELECTROSURGICAL) ×2
ELECTRODE REM PT RTRN 9FT ADLT (ELECTROSURGICAL) ×1 IMPLANT
GAUZE 4X4 16PLY ~~LOC~~+RFID DBL (SPONGE) ×1 IMPLANT
GAUZE SPONGE 4X4 12PLY STRL (GAUZE/BANDAGES/DRESSINGS) ×2 IMPLANT
GLOVE SURG LTX SZ7.5 (GLOVE) ×1 IMPLANT
GLOVE SURG LTX SZ8.5 (GLOVE) ×2 IMPLANT
GLOVE SURG UNDER POLY LF SZ7 (GLOVE) ×3 IMPLANT
GLOVE SURG UNDER POLY LF SZ7.5 (GLOVE) ×1 IMPLANT
GLOVE SURG UNDER POLY LF SZ8.5 (GLOVE) ×2 IMPLANT
GOWN STRL REUS W/ TWL LRG LVL3 (GOWN DISPOSABLE) IMPLANT
GOWN STRL REUS W/ TWL XL LVL3 (GOWN DISPOSABLE) IMPLANT
GOWN STRL REUS W/TWL 2XL LVL3 (GOWN DISPOSABLE) ×2 IMPLANT
GOWN STRL REUS W/TWL LRG LVL3 (GOWN DISPOSABLE) ×2
GOWN STRL REUS W/TWL XL LVL3 (GOWN DISPOSABLE) ×2
HEMOSTAT POWDER SURGIFOAM 1G (HEMOSTASIS) ×1 IMPLANT
KIT BASIN OR (CUSTOM PROCEDURE TRAY) ×2 IMPLANT
KIT TURNOVER KIT B (KITS) ×2 IMPLANT
NDL SPNL 20GX3.5 QUINCKE YW (NEEDLE) IMPLANT
NEEDLE HYPO 22GX1.5 SAFETY (NEEDLE) ×2 IMPLANT
NEEDLE SPNL 20GX3.5 QUINCKE YW (NEEDLE) IMPLANT
NS IRRIG 1000ML POUR BTL (IV SOLUTION) ×2 IMPLANT
PACK LAMINECTOMY NEURO (CUSTOM PROCEDURE TRAY) ×2 IMPLANT
PAD ARMBOARD 7.5X6 YLW CONV (MISCELLANEOUS) ×5 IMPLANT
PATTIES SURGICAL .5 X1 (DISPOSABLE) ×2 IMPLANT
PLASMABLADE 3.0S (MISCELLANEOUS) ×2
SPONGE SURGIFOAM ABS GEL SZ50 (HEMOSTASIS) ×2 IMPLANT
SPONGE T-LAP 4X18 ~~LOC~~+RFID (SPONGE) ×1 IMPLANT
SUT VIC AB 1 CT1 18XBRD ANBCTR (SUTURE) ×1 IMPLANT
SUT VIC AB 1 CT1 8-18 (SUTURE) ×2
SUT VIC AB 2-0 CP2 18 (SUTURE) ×2 IMPLANT
SUT VIC AB 3-0 SH 8-18 (SUTURE) ×2 IMPLANT
SUT VIC AB 4-0 RB1 18 (SUTURE) ×2 IMPLANT
TOWEL GREEN STERILE (TOWEL DISPOSABLE) ×2 IMPLANT
TOWEL GREEN STERILE FF (TOWEL DISPOSABLE) ×2 IMPLANT
WATER STERILE IRR 1000ML POUR (IV SOLUTION) ×2 IMPLANT

## 2021-11-11 NOTE — H&P (Signed)
CHIEF COMPLAINT: Bilateral lower extremity pain and weakness since November, severely.  HISTORY OF PRESENT ILLNESS: Alexis Miller is a 31 year old right-handed individual who tells me that she started developing back pain in May of last year and she was trying to modify her activities to deal with it, but she notes that subsequently the pain started getting worse and she started getting radiation down into the lower extremities.  This happened rather severely, particularly in September and she was seen in the emergency room where ultimately she underwent an MRI of the lumbar spine.  She had a series of 3 epidural injections performed each at L4-L5, where she has the ruptured disc, and she notes that this did nothing to help the pain.  She was sent to physical therapy and she notes that she could barely tolerate 3 visits, each of which seemed to only exacerbate her pain in the back and the lower extremities.  She has been hoping that with the passage of time, things would improve and has sought this visit. This prior weekend, she was seen in the emergency department because the pain was unbearable.  She notes the pain mostly radiates down the right leg, but she notes that her left leg has felt weak. Her scan was evaluated by a specialist at [facility], Dr. Myer Haff, who told her that he wouldn't consider surgery for her.  PAST MEDICAL HISTORY: Notable for significant obesity with a BMI of 54.  Her other health, however, is good.  She reports no significant medical issues whatsoever.  PHYSICAL EXAM: I note that she stands straight and erect.  She walks with a modest antalgia involving the left lower extremity, and on individual muscle testing, she has a 4-/5 tibialis anterior strength on the left side with a 0/5 extensor hallucis longus strength on both sides.  Gastroc strength appears intact, albeit the reflexes are absent in the patellae and the Achilles, 1+ in the biceps and the upper extremities.   Sensation is only modestly diminished to vibratory sensation in the left lower extremity, but straight leg raising is markedly positive at 15 degrees in either lower extremity.  IMAGING: Review of the MRI that was performed in September of this year demonstrates a large central disc herniation at the level of L4-L5. This causes substantial thecal sac compression.  She has noted control of bowels and bladder, though she feels that her bowel control has been changing significantly, and she also has had some numbness in the vaginal area that she has become aware of over the past months.  IMPRESSION: The patient has a large central disc herniation at the level of L4-L5.  She has failed efforts at conservative management and given the size of the disc herniation, I believe that she should undergo surgical decompression of the disc herniation at L4-L5.  There are no signs of any clinical instability at this time, and I noted that given the size of the disc herniation, she will likely need bilateral laminotomy to access the disc adequately and do an adequate central canal decompression.  I noted my concerns about doing a simple decompression, noting that given the size of this disc herniation, the potential for recurrence, I believe, is somewhat increased.  I noted, though, that without any instability, decompressing the central canal should give her relief of the worst of her symptoms, and hopefully will allow her to recover good in full function.  If the disc should recur, then ultimately, I believe that this area should be decompressed and  stabilized. In that regard, today in the office, I obtained a lateral flexion-extension film with an AP view of both obliques.  There is no evidence of a pars defect on the x-rays, and there is no evidence of a significant listhesis between flexion and extension.  The alignment of the vertebrae is good.  There is disc height loss at the L4-L5 level. Having seen the radiographs,  we will proceed with scheduling a microdiscectomy at L4-L5 to be done bilaterally as the disc herniation is large and central and will likely require a bilateral approach for an adequate decompression to be performed.

## 2021-11-11 NOTE — Op Note (Signed)
Date of surgery: 11/11/2021 Preoperative diagnosis: Herniated nucleus pulposus L4-L5, morbid obesity, lumbar radiculopathy. Postoperative diagnosis: Same Procedure: Bilateral laminotomies and discectomy L4-L5 with decompression of the L4 and L5 nerve roots and the central dural tube. Surgeon: Barnett Abu First Assistant: Ervin Knack, MD Anesthesia: General endotracheal Indications: Alexis Miller is a 31 year old individual who has had significant back and bilateral leg pain worse on the right than on the left.  She has a large central disc herniation that has been aggravating the pain and lately she has noted weakness in the left lower extremity.  She is advised regarding the need for surgery to decompress her central canal L4-L5 from a large disc herniation.  Procedure: Patient was brought to the operating room supine on the stretcher.  After the smooth induction of general endotracheal anesthesia, she was carefully turned prone.  The back was prepped with alcohol DuraPrep and draped in a sterile fashion.  Midline incision was created and carried down to the lumbodorsal fascia which was opened on either side midline.  Localizing radiographs were taken to identify positively laminar arch of L4.  Once this was verified a self-retaining retractor was placed on the right side to expose the laminar arch and interlaminar space at L4-L5.  A laminotomy was then created using a high-speed drill removing the inferior margin lamina of L4 out to the and including a portion of the mesial facet.  Less than the third of the facet was removed.  The thickened redundant yellow ligament in this area was taken up and the common dural tube was exposed to was dissected carefully on the lateral border of the tube could identify the role of the dura and this was gently reduced reflected from a significant mass that protruded directly vertically from the area of the disc space.  The dura was carefully protected using a  D'Errico retractor and the underlying dura was incised with a 15 blade combination of curettes and rongeurs was then used to initially remove some small fragments of disc material but by dissecting further and further were able to reach and found that a good portion of the disc was still intact in the disc space a discectomy was then performed removing disc material from within the disc space and by further dissecting medially disc material was removed from the subligamentous space and under the vertebral body of L4 specifically.  Less this was noted under the vertebral body of L5.  This was all removed from this right-sided approach and clearly good decompression of the common dural tube and the path of the L5 nerve root was achieved the L4 nerve root superiorly was sounded and found to be free and clear.  Once hemostasis was achieved with bipolar cautery and allowing some pledgets of cottonoids over [place] to achieve hemostasis attention was then turned to the left side.  Here similar laminotomy and foraminotomies was carried out.  The L ligament was removed and the underlying dura was noted to be somewhat tensed over a significant bulge the bulge was incised and several other fragments of disc were removed from this area and the disc base was entered the disc material from within the disc space was removed along with some endplate material that came free easily some other endplate material was dissected free ventrally.  The posterior longitudinal ligament that was thickened and redundant was removed using a 2 and 3 mm Kerrison punch and several other subligamentous fragments of disc material were also removed this allowed for good decompression  of the common dural tube and the L5 nerve root on this left side the L4 nerve root was similarly sounded and found to be well decompressed with this hemostasis was achieved half percent Marcaine for a total volume of 25 cc was injected into the paraspinous musculature  and fascia and when hemostasis was verified the retractors were removed the lumbodorsal fascia was closed with #1 Vicryl in interrupted fashion, and 2-0 Vicryl was used in the subcutaneous tissues.  3-0 Vicryl was used in the subcuticular layer with 4-0 Vicryl also being used there Dermabond was placed on the skin blood loss was estimated less than 75 cc.  The patient tolerated procedure well is returned to recovery room in stable condition.

## 2021-11-11 NOTE — Plan of Care (Signed)
Patient ready for discharge. Surgical back pain controlled with prescribed  pain meds. No signs of acute distress noted. Surgical site with dressing clean,dry and intact.  Discharge instructions given/explained to patient and verbalized understanding.

## 2021-11-11 NOTE — Anesthesia Preprocedure Evaluation (Signed)
Anesthesia Evaluation  Patient identified by MRN, date of birth, ID band Patient awake    Reviewed: Allergy & Precautions, NPO status , Patient's Chart, lab work & pertinent test results  History of Anesthesia Complications Negative for: history of anesthetic complications  Airway Mallampati: III  TM Distance: >3 FB Neck ROM: Full    Dental  (+) Dental Advisory Given, Teeth Intact   Pulmonary neg pulmonary ROS,    breath sounds clear to auscultation       Cardiovascular negative cardio ROS   Rhythm:Regular     Neuro/Psych negative neurological ROS     GI/Hepatic   Endo/Other  Morbid obesity  Renal/GU      Musculoskeletal   Abdominal   Peds  Hematology   Anesthesia Other Findings PCOS  Reproductive/Obstetrics Lab Results      Component                Value               Date                      PREGTESTUR               NEGATIVE            11/11/2021                                        Anesthesia Physical Anesthesia Plan  ASA: 3  Anesthesia Plan: General   Post-op Pain Management: Toradol IV (intra-op) and Ofirmev IV (intra-op)   Induction: Intravenous  PONV Risk Score and Plan: 3 and Ondansetron, Dexamethasone, Propofol infusion and Midazolam  Airway Management Planned: Oral ETT  Additional Equipment: None  Intra-op Plan:   Post-operative Plan: Extubation in OR  Informed Consent: I have reviewed the patients History and Physical, chart, labs and discussed the procedure including the risks, benefits and alternatives for the proposed anesthesia with the patient or authorized representative who has indicated his/her understanding and acceptance.     Dental advisory given  Plan Discussed with: CRNA and Anesthesiologist  Anesthesia Plan Comments:         Anesthesia Quick Evaluation

## 2021-11-11 NOTE — Anesthesia Procedure Notes (Signed)
Procedure Name: Intubation Date/Time: 11/11/2021 12:08 PM Performed by: Imagene Riches, CRNA Pre-anesthesia Checklist: Patient identified, Emergency Drugs available, Suction available and Patient being monitored Patient Re-evaluated:Patient Re-evaluated prior to induction Oxygen Delivery Method: Circle System Utilized Preoxygenation: Pre-oxygenation with 100% oxygen Induction Type: IV induction Ventilation: Mask ventilation without difficulty Laryngoscope Size: Miller and 2 Grade View: Grade I Tube type: Oral Tube size: 7.5 mm Number of attempts: 1 Airway Equipment and Method: Stylet and Oral airway Placement Confirmation: ETT inserted through vocal cords under direct vision, positive ETCO2 and breath sounds checked- equal and bilateral Secured at: 24 cm Tube secured with: Tape Dental Injury: Teeth and Oropharynx as per pre-operative assessment

## 2021-11-11 NOTE — Evaluation (Signed)
Physical Therapy Evaluation and Discharge Patient Details Name: Alexis Miller MRN: [id number] DOB: 17-Oct-1990 Today's Date: 11/11/2021  History of Present Illness  Pt is a 31 y.o. F who presents with herniated nucleus pulposus L4-5 now s/p bilateral laminotomies and discectomy L4-5 with decompression of L4 and 5 nerve roots and central dural tube. Significant PMH: obesity.  Clinical Impression  Patient evaluated by Physical Therapy with no further acute PT needs identified. Pt with good pain control; denies radicular pain or numbness/tingling. Pt ambulating 400 feet with no assistive device and negotiated 2 steps without physical assist. Education provided regarding spinal precautions, activity recommendations, car transfer technique. Pt states they have plans to have their 3 young children taken care of for the next 6 weeks while she recovers. All education has been completed and the patient has no further questions. No follow-up Physical Therapy or equipment needs. PT is signing off. Thank you for this referral.      Recommendations for follow up therapy are one component of a multi-disciplinary discharge planning process, led by the attending physician.  Recommendations may be updated based on patient status, additional functional criteria and insurance authorization.  Follow Up Recommendations No PT follow up    Assistance Recommended at Discharge PRN  Patient can return home with the following  Assistance with cooking/housework    Equipment Recommendations None recommended by PT  Recommendations for Other Services       Functional Status Assessment Patient has had a recent decline in their functional status and demonstrates the ability to make significant improvements in function in a reasonable and predictable amount of time.     Precautions / Restrictions Precautions Precautions: Back Precaution Booklet Issued: Yes (comment) Restrictions Weight Bearing Restrictions: No       Mobility  Bed Mobility Overal bed mobility: Modified Independent             General bed mobility comments: cues for log roll technique    Transfers Overall transfer level: Independent Equipment used: None                    Ambulation/Gait Ambulation/Gait assistance: Modified independent (Device/Increase time) Gait Distance (Feet): 400 Feet Assistive device: None Gait Pattern/deviations: Step-through pattern, Decreased stride length Gait velocity: decreased Gait velocity interpretation: 1.31 - 2.62 ft/sec, indicative of limited community ambulator   General Gait Details: Slower pace for age, no gross instability noted  Stairs Stairs: Yes Stairs assistance: Min guard   Number of Stairs: 2 General stair comments: cues for step by step  Wheelchair Mobility    Modified Rankin (Stroke Patients Only)       Balance Overall balance assessment: Mild deficits observed, not formally tested                                           Pertinent Vitals/Pain Pain Assessment Pain Assessment: Faces Faces Pain Scale: Hurts little more Pain Location: back Pain Descriptors / Indicators: Operative site guarding Pain Intervention(s): Limited activity within patient's tolerance, Monitored during session    Home Living Family/patient expects to be discharged to:: Private residence Living Arrangements: Spouse/significant other;Children Available Help at Discharge: Family (52 m.o., 41, 31 year old) Type of Home: House Home Access: Stairs to enter Entrance Stairs-Rails: None Entrance Stairs-Number of Steps: 2   Home Layout: One level Home Equipment: None      Prior Function Prior  Level of Function : Independent/Modified Independent             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       Hand Dominance        Extremity/Trunk Assessment   Upper Extremity Assessment Upper Extremity Assessment: Overall WFL for tasks assessed    Lower  Extremity Assessment Lower Extremity Assessment: RLE deficits/detail;LLE deficits/detail RLE Deficits / Details: Strength 5/5 LLE Deficits / Details: Strength 5/5    Cervical / Trunk Assessment Cervical / Trunk Assessment: Back Surgery  Communication   Communication: No difficulties  Cognition Arousal/Alertness: Awake/alert Behavior During Therapy: WFL for tasks assessed/performed Overall Cognitive Status: Within Functional Limits for tasks assessed                                          General Comments      Exercises     Assessment/Plan    PT Assessment Patient does not need any further PT services  PT Problem List         PT Treatment Interventions      PT Goals (Current goals can be found in the Care Plan section)  Acute Rehab PT Goals Patient Stated Goal: less pain, return to baseline PT Goal Formulation: All assessment and education complete, DC therapy    Frequency       Co-evaluation               AM-PAC PT "6 Clicks" Mobility  Outcome Measure Help needed turning from your back to your side while in a flat bed without using bedrails?: None Help needed moving from lying on your back to sitting on the side of a flat bed without using bedrails?: None Help needed moving to and from a bed to a chair (including a wheelchair)?: None Help needed standing up from a chair using your arms (e.g., wheelchair or bedside chair)?: None Help needed to walk in hospital room?: None Help needed climbing 3-5 steps with a railing? : None 6 Click Score: 24    End of Session   Activity Tolerance: Patient tolerated treatment well Patient left: in bed;with call bell/phone within reach;with family/visitor present Nurse Communication: Mobility status PT Visit Diagnosis: Pain;Difficulty in walking, not elsewhere classified (R26.2) Pain - part of body:  (back)    Time: 5726-2035 PT Time Calculation (min) (ACUTE ONLY): 14 min   Charges:   PT  Evaluation $PT Eval Low Complexity: 1 Low          Lillia Pauls, PT, DPT Acute Rehabilitation Services Pager [phone] Office [phone]   Norval Morton 11/11/2021, 5:06 PM

## 2021-11-11 NOTE — Transfer of Care (Signed)
Immediate Anesthesia Transfer of Care Note  Patient: Alexis Miller  Procedure(s) Performed: Bilateral Lumbar four-five Microdiscectomy (Bilateral: Back)  Patient Location: PACU  Anesthesia Type:General  Level of Consciousness: drowsy  Airway & Oxygen Therapy: Patient Spontanous Breathing and Patient connected to nasal cannula oxygen  Post-op Assessment: Report given to RN and Post -op Vital signs reviewed and stable  Post vital signs: Reviewed and stable  Last Vitals:  Vitals Value Taken Time  BP 122/66 11/11/21 1415  Temp    Pulse 92 11/11/21 1418  Resp 16 11/11/21 1418  SpO2 100 % 11/11/21 1418  Vitals shown include unvalidated device data.  Last Pain:  Vitals:   11/11/21 0948  TempSrc:   PainSc: 8       Patients Stated Pain Goal: 4 ([id number])  Complications: No notable events documented.

## 2021-11-11 NOTE — Discharge Summary (Signed)
Physician Discharge Summary  Patient ID: Alexis Miller MRN: [id number] DOB/AGE: [birth year] 32 y.o.  Admit date: 11/11/2021 Discharge date: 11/11/2021  Admission Diagnoses:Herniated nucleus pulposis, Lumbar radiculopathy, morbid obesity  Discharge Diagnoses: Herniated nucleus pulposis, Lumbar radiculopathy, morbid obesity Principal Problem:   Herniated nucleus pulposus, L4-5   Discharged Condition: good  Hospital Course: Tolerated surgery well  Consults: None  Significant Diagnostic Studies: none  Treatments: surgery: see op note  Discharge Exam: Blood pressure 121/70, pulse 90, temperature 99.5 F (37.5 C), temperature source Oral, resp. rate 18, height 5\' 7"  (1.702 m), weight (!) 155.1 kg, last menstrual period 11/02/2021, SpO2 95 %. Incision clean and dry, motor strength good  Disposition: Discharge disposition: 01-Home or Self Care       Discharge Instructions     Call MD for:  redness, tenderness, or signs of infection (pain, swelling, redness, odor or green/yellow discharge around incision site)   Complete by: As directed    Call MD for:  severe uncontrolled pain   Complete by: As directed    Call MD for:  temperature >100.4   Complete by: As directed    Diet - low sodium heart healthy   Complete by: As directed    Diet Carb Modified   Complete by: As directed    Discharge wound care:   Complete by: As directed    Okay to shower. Do not apply salves or appointments to incision. No heavy lifting with the upper extremities greater than 10 pounds. May resume driving when not requiring pain medication and patient feels comfortable with doing so.   Incentive spirometry RT   Complete by: As directed    Increase activity slowly   Complete by: As directed       Allergies as of 11/11/2021       Reactions   Cephalosporins Hives        Medication List     TAKE these medications    acetaminophen 500 MG tablet Commonly known as: TYLENOL Take 500 mg by  mouth every 6 (six) hours as needed for moderate pain.   albuterol 108 (90 Base) MCG/ACT inhaler Commonly known as: VENTOLIN HFA Inhale 2 puffs into the lungs every 6 (six) hours as needed for wheezing or shortness of breath.   ergocalciferol 1.25 MG (50000 UT) capsule Commonly known as: VITAMIN D2 Take 50,000 Units by mouth once a week.   ibuprofen 200 MG tablet Commonly known as: ADVIL Take 600 mg by mouth every 6 (six) hours as needed for moderate pain.   Junel FE 1/20 1-20 MG-MCG tablet Generic drug: norethindrone-ethinyl estradiol-FE Take 1 tablet by mouth daily.   loratadine 10 MG tablet Commonly known as: CLARITIN Take 10 mg by mouth daily.   metFORMIN 750 MG 24 hr tablet Commonly known as: GLUCOPHAGE-XR Take 750 mg by mouth 2 (two) times daily.   methocarbamol 500 MG tablet Commonly known as: ROBAXIN Take 1 tablet (500 mg total) by mouth every 6 (six) hours as needed for muscle spasms (30).   oxyCODONE-acetaminophen 5-325 MG tablet Commonly known as: PERCOCET/ROXICET Take 1-2 tablets by mouth every 6 (six) hours as needed for severe pain. What changed: how much to take   pantoprazole 20 MG tablet Commonly known as: PROTONIX Take 20 mg by mouth daily.               Discharge Care Instructions  (From admission, onward)           Start     Ordered  11/11/21 0000  Discharge wound care:       Comments: Okay to shower. Do not apply salves or appointments to incision. No heavy lifting with the upper extremities greater than 10 pounds. May resume driving when not requiring pain medication and patient feels comfortable with doing so.   11/11/21 2200             Signed: Shary Key Quintyn Dombek 11/11/2021, 10:01 PM

## 2021-11-12 ENCOUNTER — Encounter (HOSPITAL_COMMUNITY): Payer: Self-pay | Admitting: Neurological Surgery

## 2021-11-14 NOTE — Anesthesia Postprocedure Evaluation (Signed)
Anesthesia Post Note  Patient: [place] R Garfinkel  Procedure(s) Performed: Bilateral Lumbar four-five Microdiscectomy (Bilateral: Back)     Patient location during evaluation: PACU Anesthesia Type: General Level of consciousness: awake and alert Pain management: pain level controlled Vital Signs Assessment: post-procedure vital signs reviewed and stable Respiratory status: spontaneous breathing, nonlabored ventilation, respiratory function stable and patient connected to nasal cannula oxygen Cardiovascular status: blood pressure returned to baseline and stable Postop Assessment: no apparent nausea or vomiting Anesthetic complications: no   No notable events documented.  Last Vitals:  Vitals:   11/11/21 1609 11/11/21 2105  BP: 135/89 121/70  Pulse: 84 90  Resp: 18   Temp:  37.5 C  SpO2: 99% 95%    Last Pain:  Vitals:   11/11/21 2208  TempSrc:   PainSc: 4                  Dontrelle Mazon

## 2021-12-05 DIAGNOSIS — E282 Polycystic ovarian syndrome: Secondary | ICD-10-CM | POA: Diagnosis not present

## 2021-12-05 DIAGNOSIS — K219 Gastro-esophageal reflux disease without esophagitis: Secondary | ICD-10-CM | POA: Diagnosis not present

## 2021-12-05 DIAGNOSIS — Z6841 Body Mass Index (BMI) 40.0 and over, adult: Secondary | ICD-10-CM | POA: Diagnosis not present

## 2022-01-16 DIAGNOSIS — E282 Polycystic ovarian syndrome: Secondary | ICD-10-CM | POA: Diagnosis not present

## 2022-01-16 DIAGNOSIS — Z6841 Body Mass Index (BMI) 40.0 and over, adult: Secondary | ICD-10-CM | POA: Diagnosis not present

## 2022-01-16 DIAGNOSIS — K219 Gastro-esophageal reflux disease without esophagitis: Secondary | ICD-10-CM | POA: Diagnosis not present

## 2022-02-03 ENCOUNTER — Ambulatory Visit
Admission: RE | Admit: 2022-02-03 | Discharge: 2022-02-03 | Disposition: A | Discharge status: Home / Self Care | Payer: BC Managed Care – PPO | Source: Ambulatory Visit | Attending: Family Medicine | Admitting: Family Medicine

## 2022-02-03 VITALS — BP 124/69 | HR 84 | Temp 98.7°F | Resp 18

## 2022-02-03 DIAGNOSIS — J302 Other seasonal allergic rhinitis: Secondary | ICD-10-CM

## 2022-02-03 DIAGNOSIS — J209 Acute bronchitis, unspecified: Secondary | ICD-10-CM | POA: Diagnosis not present

## 2022-02-03 DIAGNOSIS — J019 Acute sinusitis, unspecified: Secondary | ICD-10-CM | POA: Diagnosis not present

## 2022-02-03 MED ORDER — DOXYCYCLINE HYCLATE 100 MG PO CAPS: 100.0000 mg | ORAL_CAPSULE | Freq: Two times a day (BID) | ORAL | 0 refills | Status: DC

## 2022-02-03 MED ORDER — PREDNISONE 20 MG PO TABS: 40.0000 mg | ORAL_TABLET | Freq: Every day | ORAL | 0 refills | Status: DC

## 2022-02-03 NOTE — ED Triage Notes (Addendum)
Pt here with cough and congestion x 3 weeks. Pt has allergies normally, but her current sxs are not relieved with her OTC meds.  ?

## 2022-02-03 NOTE — ED Provider Notes (Signed)
?[facility] ? ? ? ?CSN: [id number] ?Arrival date & time: 02/03/22  1700 ? ? ?  ? ?History   ?Chief Complaint ?Chief Complaint  ?Patient presents with  ? Cough  ?  Entered by patient  ? Nasal Congestion  ? ? ?HPI ?Alexis Miller is a 31 y.o. female.  ? ?HPI ?Patient presents for evaluation of cough, congestion, and 3 weeks. No fever. No known sick contacts. Cough is occasionally productive. Patient has asthma and has experienced some wheezing. Take Claritin and Albuterol since onset of symptoms. Nasal congestion and sinus pressure appear to be worsening.  ?Past Medical History:  ?Diagnosis Date  ? Asthma   ? Family history of adverse reaction to anesthesia   ? N & V  ? PCOS (polycystic ovarian syndrome)   ? Ruptured lumbar disc 2022  ? ? ?Patient Active Problem List  ? Diagnosis Date Noted  ? Herniated nucleus pulposus, L4-5 11/11/2021  ? Morbid obesity (HCC) 07/02/2015  ? ? ?Past Surgical History:  ?Procedure Laterality Date  ? DILATION AND CURETTAGE OF UTERUS  10/13/2009  ? LUMBAR LAMINECTOMY/DECOMPRESSION MICRODISCECTOMY Bilateral 11/11/2021  ? Procedure: Bilateral Lumbar four-five Microdiscectomy;  Surgeon: Barnett Abu, MD;  Location: [facility] OR;  Service: Neurosurgery;  Laterality: Bilateral;  ? TYMPANOSTOMY TUBE PLACEMENT Bilateral 1995  ? WISDOM TOOTH EXTRACTION Bilateral   ? ? ?OB History   ?No obstetric history on file. ?  ? ? ? ?Home Medications   ? ?Prior to Admission medications   ?Medication Sig Start Date End Date Taking? Authorizing Provider  ?doxycycline (VIBRAMYCIN) 100 MG capsule Take 1 capsule (100 mg total) by mouth 2 (two) times daily. 02/03/22  Yes Bing Neighbors, FNP  ?predniSONE (DELTASONE) 20 MG tablet Take 2 tablets (40 mg total) by mouth daily with breakfast. 02/03/22  Yes Bing Neighbors, FNP  ?acetaminophen (TYLENOL) 500 MG tablet Take 500 mg by mouth every 6 (six) hours as needed for moderate pain.    [provider]  ?albuterol (VENTOLIN HFA) 108 (90 Base) MCG/ACT  inhaler Inhale 2 puffs into the lungs every 6 (six) hours as needed for wheezing or shortness of breath.    [provider]  ?ergocalciferol (VITAMIN D2) 1.25 MG (50000 UT) capsule Take 50,000 Units by mouth once a week. 11/05/21   [provider]  ?ibuprofen (ADVIL) 200 MG tablet Take 600 mg by mouth every 6 (six) hours as needed for moderate pain.    [provider]  ?JUNEL FE 1/20 1-20 MG-MCG tablet Take 1 tablet by mouth daily. 08/30/21   [provider]  ?loratadine (CLARITIN) 10 MG tablet Take 10 mg by mouth daily.    [provider]  ?metFORMIN (GLUCOPHAGE-XR) 750 MG 24 hr tablet Take 750 mg by mouth 2 (two) times daily.    [provider]  ?methocarbamol (ROBAXIN) 500 MG tablet Take 1 tablet (500 mg total) by mouth every 6 (six) hours as needed for muscle spasms (30). 11/11/21   Barnett Abu, MD  ?oxyCODONE-acetaminophen (PERCOCET/ROXICET) 5-325 MG tablet Take 1-2 tablets by mouth every 6 (six) hours as needed for severe pain. 11/11/21   Barnett Abu, MD  ?pantoprazole (PROTONIX) 20 MG tablet Take 20 mg by mouth daily.    [provider]  ? ? ?Family History ?Family History  ?Problem Relation Age of Onset  ? Arthritis Mother   ? Arthritis Father   ? Hyperlipidemia Father   ? Hypertension Father   ? Arthritis Maternal Grandmother   ?  Arthritis Maternal Grandfather   ? Heart disease Maternal Grandfather   ? Hypertension Maternal Grandfather   ? Arthritis Paternal Grandmother   ? Arthritis Paternal Grandfather   ? Hypertension Paternal Grandfather   ? ? ?Social History ?Social History  ? ?Tobacco Use  ? Smoking status: Never  ? Smokeless tobacco: Never  ?Vaping Use  ? Vaping Use: Never used  ?Substance Use Topics  ? Alcohol use: No  ?  Alcohol/week: 0.0 standard drinks  ? Drug use: No  ? ? ? ?Allergies   ?Cephalosporins ? ? ?Review of Systems ?Review of Systems ?Pertinent negatives listed in HPI  ? ?Physical Exam ?Triage Vital Signs ?ED Triage Vitals   ?Enc Vitals Group  ?   BP 02/03/22 1725 124/69  ?   Pulse Rate 02/03/22 1725 84  ?   Resp 02/03/22 1725 18  ?   Temp 02/03/22 1725 98.7 ?F (37.1 ?C)  ?   Temp Source 02/03/22 1725 Oral  ?   SpO2 02/03/22 1725 97 %  ?   Weight --   ?   Height --   ?   Head Circumference --   ?   Peak Flow --   ?   Pain Score 02/03/22 1716 0  ?   Pain Loc --   ?   Pain Edu? --   ?   Excl. in GC? --   ? ?No data found. ? ?Updated Vital Signs ?BP 124/69 (BP Location: Left Arm)   Pulse 84   Temp 98.7 ?F (37.1 ?C) (Oral)   Resp 18   SpO2 97%  ? ?Visual Acuity ?Right Eye Distance:   ?Left Eye Distance:   ?Bilateral Distance:   ? ?Right Eye Near:   ?Left Eye Near:    ?Bilateral Near:    ? ?Physical Exam ? ? ?General Appearance:    Alert, cooperative, no distress  ?HENT:   Normocephalic, ears normal, nares mucosal edema with congestion, rhinorrhea, oropharynx  clear   ?Eyes:    PERRL, conjunctiva/corneas clear, EOM's intact       ?Lungs:     Clear to auscultation bilaterally, respirations unlabored  ?Heart:    Regular rate and rhythm  ?Neurologic:   Awake, alert, oriented x 3. No apparent focal neurological           defect.   ?  ?UC Treatments / Results  ?Labs ?(all labs ordered are listed, but only abnormal results are displayed) ?Labs Reviewed - No data to display ? ?EKG ? ? ?Radiology ?No results found. ? ?Procedures ?Procedures (including critical care time) ? ?Medications Ordered in UC ?Medications - No data to display ? ?Initial Impression / Assessment and Plan / UC Course  ?I have reviewed the triage vital signs and the nursing notes. ? ?Pertinent labs & imaging results that were available during my care of the patient were reviewed by me and considered in my medical decision making (see chart for details). ? ?  ?Treatment for acute bronchitis and acute sinusitis secondary to seasonal allergies ?Prednisone and doxycycline take as prescribed.  Continue antihistamine therapy and albuterol. ?Follow-up with PCP or return here as  needed. ?Final Clinical Impressions(s) / UC Diagnoses  ? ?Final diagnoses:  ?Acute bronchitis, unspecified organism  ?Seasonal allergies  ?Acute non-recurrent sinusitis, unspecified location  ? ?Discharge Instructions   ?None ?  ? ?ED Prescriptions   ? ? Medication Sig Dispense Auth. Provider  ? predniSONE (DELTASONE) 20 MG tablet Take 2 tablets (40 mg total)  by mouth daily with breakfast. 10 tablet Bing Neighbors, FNP  ? doxycycline (VIBRAMYCIN) 100 MG capsule Take 1 capsule (100 mg total) by mouth 2 (two) times daily. 20 capsule Bing Neighbors, FNP  ? ?  ? ?PDMP not reviewed this encounter. ?  ?Bing Neighbors, FNP ?02/03/22 1742 ? ?

## 2022-02-27 DIAGNOSIS — M5430 Sciatica, unspecified side: Secondary | ICD-10-CM | POA: Diagnosis not present

## 2022-02-27 DIAGNOSIS — E669 Obesity, unspecified: Secondary | ICD-10-CM | POA: Diagnosis not present

## 2022-02-27 DIAGNOSIS — Z6841 Body Mass Index (BMI) 40.0 and over, adult: Secondary | ICD-10-CM | POA: Diagnosis not present

## 2022-02-27 DIAGNOSIS — J45909 Unspecified asthma, uncomplicated: Secondary | ICD-10-CM | POA: Diagnosis not present

## 2022-04-10 DIAGNOSIS — Z6841 Body Mass Index (BMI) 40.0 and over, adult: Secondary | ICD-10-CM | POA: Diagnosis not present

## 2022-04-10 DIAGNOSIS — E282 Polycystic ovarian syndrome: Secondary | ICD-10-CM | POA: Diagnosis not present

## 2022-05-01 DIAGNOSIS — Z6841 Body Mass Index (BMI) 40.0 and over, adult: Secondary | ICD-10-CM | POA: Diagnosis not present

## 2022-05-01 DIAGNOSIS — K219 Gastro-esophageal reflux disease without esophagitis: Secondary | ICD-10-CM | POA: Diagnosis not present

## 2022-05-01 DIAGNOSIS — E282 Polycystic ovarian syndrome: Secondary | ICD-10-CM | POA: Diagnosis not present

## 2022-06-05 DIAGNOSIS — Z01411 Encounter for gynecological examination (general) (routine) with abnormal findings: Secondary | ICD-10-CM | POA: Diagnosis not present

## 2022-06-05 DIAGNOSIS — E559 Vitamin D deficiency, unspecified: Secondary | ICD-10-CM | POA: Diagnosis not present

## 2022-06-05 DIAGNOSIS — Z01818 Encounter for other preprocedural examination: Secondary | ICD-10-CM | POA: Diagnosis not present

## 2022-06-05 DIAGNOSIS — Z6841 Body Mass Index (BMI) 40.0 and over, adult: Secondary | ICD-10-CM | POA: Diagnosis not present

## 2022-06-05 DIAGNOSIS — Z713 Dietary counseling and surveillance: Secondary | ICD-10-CM | POA: Diagnosis not present

## 2022-07-02 DIAGNOSIS — Z6841 Body Mass Index (BMI) 40.0 and over, adult: Secondary | ICD-10-CM | POA: Diagnosis not present

## 2022-07-02 DIAGNOSIS — E282 Polycystic ovarian syndrome: Secondary | ICD-10-CM | POA: Diagnosis not present

## 2022-07-07 DIAGNOSIS — Z713 Dietary counseling and surveillance: Secondary | ICD-10-CM | POA: Diagnosis not present

## 2022-07-24 DIAGNOSIS — Z01818 Encounter for other preprocedural examination: Secondary | ICD-10-CM | POA: Diagnosis not present

## 2022-07-24 DIAGNOSIS — I498 Other specified cardiac arrhythmias: Secondary | ICD-10-CM | POA: Diagnosis not present

## 2022-07-24 DIAGNOSIS — I491 Atrial premature depolarization: Secondary | ICD-10-CM | POA: Diagnosis not present

## 2022-08-04 DIAGNOSIS — Z7984 Long term (current) use of oral hypoglycemic drugs: Secondary | ICD-10-CM | POA: Diagnosis not present

## 2022-08-04 DIAGNOSIS — Z6841 Body Mass Index (BMI) 40.0 and over, adult: Secondary | ICD-10-CM | POA: Diagnosis not present

## 2022-08-04 DIAGNOSIS — E282 Polycystic ovarian syndrome: Secondary | ICD-10-CM | POA: Diagnosis not present

## 2022-08-04 DIAGNOSIS — J45909 Unspecified asthma, uncomplicated: Secondary | ICD-10-CM | POA: Diagnosis not present

## 2022-08-04 DIAGNOSIS — M5126 Other intervertebral disc displacement, lumbar region: Secondary | ICD-10-CM | POA: Diagnosis not present

## 2022-08-04 DIAGNOSIS — Z79899 Other long term (current) drug therapy: Secondary | ICD-10-CM | POA: Diagnosis not present

## 2022-09-01 DIAGNOSIS — Z9884 Bariatric surgery status: Secondary | ICD-10-CM | POA: Diagnosis not present

## 2022-09-01 DIAGNOSIS — Z713 Dietary counseling and surveillance: Secondary | ICD-10-CM | POA: Diagnosis not present

## 2022-09-09 ENCOUNTER — Ambulatory Visit
Admission: RE | Admit: 2022-09-09 | Discharge: 2022-09-09 | Disposition: A | Discharge status: Home / Self Care | Payer: BC Managed Care – PPO | Source: Ambulatory Visit | Attending: Family Medicine | Admitting: Family Medicine

## 2022-09-09 ENCOUNTER — Ambulatory Visit: Payer: Self-pay

## 2022-09-09 VITALS — BP 124/79 | HR 104 | Temp 101.5°F | Resp 20

## 2022-09-09 DIAGNOSIS — J181 Lobar pneumonia, unspecified organism: Secondary | ICD-10-CM | POA: Diagnosis not present

## 2022-09-09 DIAGNOSIS — K760 Fatty (change of) liver, not elsewhere classified: Secondary | ICD-10-CM | POA: Diagnosis not present

## 2022-09-09 DIAGNOSIS — R509 Fever, unspecified: Secondary | ICD-10-CM | POA: Diagnosis not present

## 2022-09-09 DIAGNOSIS — R112 Nausea with vomiting, unspecified: Secondary | ICD-10-CM

## 2022-09-09 DIAGNOSIS — E860 Dehydration: Secondary | ICD-10-CM

## 2022-09-09 DIAGNOSIS — J189 Pneumonia, unspecified organism: Secondary | ICD-10-CM | POA: Diagnosis not present

## 2022-09-09 DIAGNOSIS — R1110 Vomiting, unspecified: Secondary | ICD-10-CM | POA: Diagnosis not present

## 2022-09-09 DIAGNOSIS — R059 Cough, unspecified: Secondary | ICD-10-CM | POA: Diagnosis not present

## 2022-09-09 DIAGNOSIS — M791 Myalgia, unspecified site: Secondary | ICD-10-CM | POA: Diagnosis not present

## 2022-09-09 NOTE — ED Provider Notes (Signed)
[facility]    CSN: [id number] Arrival date & time: 09/09/22  1346      History   Chief Complaint Chief Complaint  Patient presents with   Cough    Nausea, fatigue, body aches, fever - Entered by patient   Fever   Nausea   Emesis   Generalized Body Aches    HPI Alexis Miller is a 31 y.o. female.  Patient presents with 2 day history of fever, chills, body aches, cough, nausea, vomiting.  She is not able to keep fluids down despite taking Zofran.  She is not able to take medication for her fever without vomiting.  Small bowel movement yesterday.  No chest pain, shortness of breath, diarrhea, or other symptoms. 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  The history is provided by the patient and medical records.    Past Medical History:  Diagnosis Date   Asthma    Family history of adverse reaction to anesthesia    N & V   PCOS (polycystic ovarian syndrome)    Ruptured lumbar disc 2022    Patient Active Problem List   Diagnosis Date Noted   Herniated nucleus pulposus, L4-5 11/11/2021   Morbid obesity (HCC) 07/02/2015    Past Surgical History:  Procedure Laterality Date   DILATION AND CURETTAGE OF UTERUS  10/13/2009   GASTRIC BYPASS     LUMBAR LAMINECTOMY/DECOMPRESSION MICRODISCECTOMY Bilateral 11/11/2021   Procedure: Bilateral Lumbar four-five Microdiscectomy;  Surgeon: Barnett Abu, MD;  Location: [facility] OR;  Service: Neurosurgery;  Laterality: Bilateral;   TYMPANOSTOMY TUBE PLACEMENT Bilateral 1995   WISDOM TOOTH EXTRACTION Bilateral     OB History   No obstetric history on file.      Home Medications    Prior to Admission medications   Medication Sig Start Date End Date Taking? Authorizing Provider  acetaminophen (TYLENOL) 500 MG tablet Take 500 mg by mouth every 6 (six) hours as needed for moderate pain.   Yes [provider]  albuterol (VENTOLIN HFA) 108 (90 Base) MCG/ACT inhaler Inhale 2 puffs into the lungs every 6 (six)  hours as needed for wheezing or shortness of breath.   Yes [provider]  ergocalciferol (VITAMIN D2) 1.25 MG (50000 UT) capsule Take 50,000 Units by mouth once a week. 11/05/21  Yes [provider]  loratadine (CLARITIN) 10 MG tablet Take 10 mg by mouth daily.   Yes [provider]  metFORMIN (GLUCOPHAGE-XR) 750 MG 24 hr tablet Take 750 mg by mouth 2 (two) times daily.   Yes [provider]  pantoprazole (PROTONIX) 20 MG tablet Take 20 mg by mouth daily.   Yes [provider]  doxycycline (VIBRAMYCIN) 100 MG capsule Take 1 capsule (100 mg total) by mouth 2 (two) times daily. 02/03/22   Bing Neighbors, FNP  ibuprofen (ADVIL) 200 MG tablet Take 600 mg by mouth every 6 (six) hours as needed for moderate pain.    [provider]  JUNEL FE 1/20 1-20 MG-MCG tablet Take 1 tablet by mouth daily. 08/30/21   [provider]  methocarbamol (ROBAXIN) 500 MG tablet Take 1 tablet (500 mg total) by mouth every 6 (six) hours as needed for muscle spasms (30). 11/11/21   Barnett Abu, MD  oxyCODONE-acetaminophen (PERCOCET/ROXICET) 5-325 MG tablet Take 1-2 tablets by mouth every 6 (six) hours as needed for severe pain. 11/11/21   Barnett Abu, MD  predniSONE (DELTASONE) 20 MG tablet Take 2 tablets (40 mg total) by mouth daily  with breakfast. 02/03/22   Bing Neighbors, FNP    Family History Family History  Problem Relation Age of Onset   Arthritis Mother    Arthritis Father    Hyperlipidemia Father    Hypertension Father    Arthritis Maternal Grandmother    Arthritis Maternal Grandfather    Heart disease Maternal Grandfather    Hypertension Maternal Grandfather    Arthritis Paternal Grandmother    Arthritis Paternal Grandfather    Hypertension Paternal Grandfather     Social History Social History   Tobacco Use   Smoking status: Never   Smokeless tobacco: Never  Vaping Use   Vaping Use: Never used  Substance Use Topics   Alcohol  use: No    Alcohol/week: 0.0 standard drinks of alcohol   Drug use: No     Allergies   Cephalosporins and Nsaids   Review of Systems Review of Systems  Constitutional:  Positive for chills and fever.  HENT:  Negative for ear pain and sore throat.   Respiratory:  Positive for cough. Negative for shortness of breath.   Cardiovascular:  Negative for chest pain and palpitations.  Gastrointestinal:  Positive for nausea and vomiting. Negative for abdominal pain and diarrhea.  Genitourinary:  Negative for dysuria and hematuria.  Skin:  Negative for color change and rash.  All other systems reviewed and are negative.    Physical Exam Triage Vital Signs ED Triage Vitals [09/09/22 1406]  Enc Vitals Group     BP 124/79     Pulse Rate (!) 104     Resp 20     Temp (!) 101.5 F (38.6 C)     Temp Source Oral     SpO2 94 %     Weight      Height      Head Circumference      Peak Flow      Pain Score 0     Pain Loc      Pain Edu?      Excl. in GC?    No data found.  Updated Vital Signs BP 124/79 (BP Location: Right Arm)   Pulse (!) 104   Temp (!) 101.5 F (38.6 C) (Oral)   Resp 20   LMP 08/26/2022   SpO2 94%   Visual Acuity Right Eye Distance:   Left Eye Distance:   Bilateral Distance:    Right Eye Near:   Left Eye Near:    Bilateral Near:     Physical Exam Vitals and nursing note reviewed.  Constitutional:      General: She is not in acute distress.    Appearance: She is well-developed. She is obese. She is ill-appearing.  HENT:     Right Ear: Tympanic membrane normal.     Left Ear: Tympanic membrane normal.     Nose: Nose normal.     Mouth/Throat:     Mouth: Mucous membranes are dry.     Pharynx: Oropharynx is clear.  Cardiovascular:     Rate and Rhythm: Normal rate and regular rhythm.     Heart sounds: Normal heart sounds.  Pulmonary:     Effort: Pulmonary effort is normal. No respiratory distress.     Breath sounds: Normal breath sounds.  Abdominal:      General: Bowel sounds are normal.     Palpations: Abdomen is soft.     Tenderness: There is no abdominal tenderness. There is no guarding or rebound.  Musculoskeletal:  Cervical back: Neck supple.  Skin:    General: Skin is warm and dry.  Neurological:     Mental Status: She is alert.  Psychiatric:        Mood and Affect: Mood normal.        Behavior: Behavior normal.      UC Treatments / Results  Labs (all labs ordered are listed, but only abnormal results are displayed) Labs Reviewed - No data to display  EKG   Radiology No results found.  Procedures Procedures (including critical care time)  Medications Ordered in UC Medications - No data to display  Initial Impression / Assessment and Plan / UC Course  I have reviewed the triage vital signs and the nursing notes.  Pertinent labs & imaging results that were available during my care of the patient were reviewed by me and considered in my medical decision making (see chart for details).   Intractable vomiting with nausea, fever, dehydration.  Patient is ill-appearing.  She is febrile and mildly tachycardic.  She has been unable to keep herself hydrated at home despite taking Zofran.  She had gastric bypass surgery on 08/04/2022.  Sending patient to the ED for evaluation.  She declines EMS and states she feels stable to drive herself home.  At that point, her husband will drive her to the ED.  She is agreeable to going to the ED for evaluation.   Final Clinical Impressions(s) / UC Diagnoses   Final diagnoses:  Intractable vomiting with nausea  Fever, unspecified  Dehydration     Discharge Instructions      Go to the emergency department for evaluation of your intractable vomiting, fever, dehydration. Gastric bypass surgery on 08/04/2022.       ED Prescriptions   None    PDMP not reviewed this encounter.   Mickie Bail, NP 09/09/22 1435

## 2022-09-09 NOTE — ED Notes (Signed)
Pt sent to the ED via car for evaluation. Pt was alert and oriented at time of dc.

## 2022-09-09 NOTE — Discharge Instructions (Signed)
Go to the emergency department for evaluation of your intractable vomiting, fever, dehydration. Gastric bypass surgery on 08/04/2022.

## 2022-09-09 NOTE — ED Triage Notes (Signed)
Pt c/o chills, fever, cough, body aches and N/V. Pt had weight loss sx 5 weeks ago.

## 2022-09-22 DIAGNOSIS — J324 Chronic pansinusitis: Secondary | ICD-10-CM | POA: Diagnosis not present

## 2022-11-26 DIAGNOSIS — E569 Vitamin deficiency, unspecified: Secondary | ICD-10-CM | POA: Diagnosis not present

## 2022-11-27 DIAGNOSIS — Z713 Dietary counseling and surveillance: Secondary | ICD-10-CM | POA: Diagnosis not present

## 2022-11-27 DIAGNOSIS — Z9884 Bariatric surgery status: Secondary | ICD-10-CM | POA: Diagnosis not present

## 2022-11-27 DIAGNOSIS — Z6841 Body Mass Index (BMI) 40.0 and over, adult: Secondary | ICD-10-CM | POA: Diagnosis not present

## 2023-01-15 ENCOUNTER — Ambulatory Visit
Admission: RE | Admit: 2023-01-15 | Discharge: 2023-01-15 | Disposition: A | Discharge status: Home / Self Care | Payer: BC Managed Care – PPO | Source: Ambulatory Visit

## 2023-01-15 VITALS — BP 103/60 | HR 66 | Temp 97.9°F | Resp 18

## 2023-01-15 DIAGNOSIS — K644 Residual hemorrhoidal skin tags: Secondary | ICD-10-CM

## 2023-01-15 MED ORDER — HYDROCORTISONE (PERIANAL) 2.5 % EX CREA: 1.0000 | TOPICAL_CREAM | Freq: Two times a day (BID) | CUTANEOUS | 0 refills | Status: AC

## 2023-01-15 NOTE — ED Triage Notes (Signed)
Pt states she is having itchy, burning Hemorid that started a week ago. Tried Epson salt bath, OTC Hemorid creams, tylenol with no relief of symptoms.

## 2023-01-15 NOTE — ED Provider Notes (Signed)
[facility]    CSN: [id number] Arrival date & time: 01/15/23  1637      History   Chief Complaint Chief Complaint  Patient presents with   Hemorrhoids    Entered by patient    HPI Alexis Miller is a 32 y.o. female.   Patient presents today with a 1 week history of rectal irritation.  She does have a history of hemorrhoids and believes that this has been flared.  She denies any recent constipation and uses MiraLAX to ensure 1 soft bowel movement per day.  She has been compliant with this regimen.  She denies any recent medication changes outside of changing her birth control.  Reports that her diet is the same.  Discomfort is rated 8/9 on a 0-10 pain scale, described as pressure with associated burning and itching, no alleviating factors identified.  She has tried multiple over-the-counter medication including which hazel, sitz bath's, Preparation H without improvement of symptoms.  She is confident that she is not pregnant.  Denies any associated hematochezia, melena, blood on toilet tissue.  She has never had any procedure or surgery to address hemorrhoids.    Past Medical History:  Diagnosis Date   Asthma    Family history of adverse reaction to anesthesia    N & V   PCOS (polycystic ovarian syndrome)    Ruptured lumbar disc 2022    Patient Active Problem List   Diagnosis Date Noted   Herniated nucleus pulposus, L4-5 11/11/2021   Morbid obesity 07/02/2015    Past Surgical History:  Procedure Laterality Date   DILATION AND CURETTAGE OF UTERUS  10/13/2009   GASTRIC BYPASS     LUMBAR LAMINECTOMY/DECOMPRESSION MICRODISCECTOMY Bilateral 11/11/2021   Procedure: Bilateral Lumbar four-five Microdiscectomy;  Surgeon: Kristeen Miss, MD;  Location: [facility];  Service: Neurosurgery;  Laterality: Bilateral;   TYMPANOSTOMY TUBE PLACEMENT Bilateral 1995   WISDOM TOOTH EXTRACTION Bilateral     OB History   No obstetric history on file.      Home Medications    Prior  to Admission medications   Medication Sig Start Date End Date Taking? Authorizing Provider  acetaminophen (TYLENOL) 500 MG tablet Take 500 mg by mouth every 6 (six) hours as needed for moderate pain.   Yes [provider]  albuterol (VENTOLIN HFA) 108 (90 Base) MCG/ACT inhaler Inhale 2 puffs into the lungs every 6 (six) hours as needed for wheezing or shortness of breath.   Yes [provider]  ergocalciferol (VITAMIN D2) 1.25 MG (50000 UT) capsule Take 50,000 Units by mouth once a week. 11/05/21  Yes [provider]  hydrocortisone (ANUSOL-HC) 2.5 % rectal cream Place 1 Application rectally 2 (two) times daily. 01/15/23  Yes Lavida Patch K, PA-C  LO LOESTRIN FE 1 MG-10 MCG / 10 MCG tablet Take 1 tablet by mouth daily. 06/09/22  Yes [provider]  loratadine (CLARITIN) 10 MG tablet Take 10 mg by mouth daily.   Yes [provider]  pantoprazole (PROTONIX) 20 MG tablet Take 20 mg by mouth daily.   Yes [provider]    Family History Family History  Problem Relation Age of Onset   Arthritis Mother    Arthritis Father    Hyperlipidemia Father    Hypertension Father    Arthritis Maternal Grandmother    Arthritis Maternal Grandfather    Heart disease Maternal Grandfather    Hypertension Maternal Grandfather    Arthritis Paternal Grandmother    Arthritis Paternal Merchant navy officer  Hypertension Paternal Grandfather     Social History Social History   Tobacco Use   Smoking status: Never   Smokeless tobacco: Never  Vaping Use   Vaping Use: Never used  Substance Use Topics   Alcohol use: No    Alcohol/week: 0.0 standard drinks of alcohol   Drug use: No     Allergies   Cephalosporins and Nsaids   Review of Systems Review of Systems  Constitutional:  Negative for activity change, appetite change, fatigue and fever.  Gastrointestinal:  Positive for rectal pain. Negative for abdominal pain, blood in stool, constipation, diarrhea,  nausea and vomiting.     Physical Exam Triage Vital Signs ED Triage Vitals  Enc Vitals Group     BP 01/15/23 1653 103/60     Pulse Rate 01/15/23 1653 66     Resp 01/15/23 1653 18     Temp 01/15/23 1653 97.9 F (36.6 C)     Temp Source 01/15/23 1653 Oral     SpO2 01/15/23 1653 98 %     Weight --      Height --      Head Circumference --      Peak Flow --      Pain Score 01/15/23 1657 8     Pain Loc --      Pain Edu? --      Excl. in [place]? --    No data found.  Updated Vital Signs BP 103/60 (BP Location: Left Arm)   Pulse 66   Temp 97.9 F (36.6 C) (Oral)   Resp 18   LMP 01/05/2023 (Exact Date)   SpO2 98%   Visual Acuity Right Eye Distance:   Left Eye Distance:   Bilateral Distance:    Right Eye Near:   Left Eye Near:    Bilateral Near:     Physical Exam Vitals reviewed.  Constitutional:      General: She is awake. She is not in acute distress.    Appearance: Normal appearance. She is well-developed. She is not ill-appearing.     Comments: Very pleasant female appears stated age in no acute distress sitting comfortably in exam room  HENT:     Head: Normocephalic and atraumatic.  Cardiovascular:     Rate and Rhythm: Normal rate and regular rhythm.     Heart sounds: Normal heart sounds, S1 normal and S2 normal. No murmur heard. Pulmonary:     Effort: Pulmonary effort is normal.     Breath sounds: Normal breath sounds. No wheezing, rhonchi or rales.     Comments: Clear to auscultation bilaterally Abdominal:     General: Bowel sounds are normal.     Palpations: Abdomen is soft.     Tenderness: There is no abdominal tenderness. There is no right CVA tenderness, left CVA tenderness, guarding or rebound.     Comments: Benign abdominal exam  Genitourinary:    Rectum: Tenderness and external hemorrhoid present.     Comments: Anderson Malta, RN present as chaperone during exam. Psychiatric:        Behavior: Behavior is cooperative.      UC Treatments / Results   Labs (all labs ordered are listed, but only abnormal results are displayed) Labs Reviewed - No data to display  EKG   Radiology No results found.  Procedures Procedures (including critical care time)  Medications Ordered in UC Medications - No data to display  Initial Impression / Assessment and Plan / UC Course  I have reviewed the  triage vital signs and the nursing notes.  Pertinent labs & imaging results that were available during my care of the patient were reviewed by me and considered in my medical decision making (see chart for details).     Patient is well-appearing, afebrile, nontoxic, nontachycardic.  External hemorrhoids noted on exam with no evidence of thrombosed hemorrhoid.  She was started on Anusol cream.  Recommend she avoid constipation by continuing MiraLAX and increasing fluids.  Discussed that she can use over-the-counter medication including Preparation H as well as sitz bath's to help manage her symptoms.  If her symptoms or not improving she is to follow-up with a specialist and was given contact information for general surgery for further evaluation and management.  Discussed that if she has any worsening or changing symptoms including increasing pain, difficulty passing a bowel movement, fever, nausea, vomiting she needs to be seen immediately.  Strict return precautions given.  Patient declined work excuse note.  Final Clinical Impressions(s) / UC Diagnoses   Final diagnoses:  External hemorrhoid     Discharge Instructions      I am concerned that you have external hemorrhoids.  Please use hydrocortisone cream twice daily.  Continue with sitz bath's and other over-the-counter medications.  Make sure that you are not constipated so make sure you are drinking plenty of fluid and using MiraLAX as previously prescribed.  If your symptoms or not improving please follow-up with general surgery.  If you have any worsening symptoms including sudden severe pain,  difficulty passing a bowel movement, blood in your stool you need to be seen immediately.     ED Prescriptions     Medication Sig Dispense Auth. Provider   hydrocortisone (ANUSOL-HC) 2.5 % rectal cream Place 1 Application rectally 2 (two) times daily. 30 g [place] Langstaff, Derry Skill, PA-C      PDMP not reviewed this encounter.   Terrilee Croak, PA-C 01/15/23 1729

## 2023-01-15 NOTE — Discharge Instructions (Signed)
I am concerned that you have external hemorrhoids.  Please use hydrocortisone cream twice daily.  Continue with sitz bath's and other over-the-counter medications.  Make sure that you are not constipated so make sure you are drinking plenty of fluid and using MiraLAX as previously prescribed.  If your symptoms or not improving please follow-up with general surgery.  If you have any worsening symptoms including sudden severe pain, difficulty passing a bowel movement, blood in your stool you need to be seen immediately.

## 2023-02-03 DIAGNOSIS — Z7182 Exercise counseling: Secondary | ICD-10-CM | POA: Diagnosis not present

## 2023-02-03 DIAGNOSIS — Z9884 Bariatric surgery status: Secondary | ICD-10-CM | POA: Diagnosis not present

## 2023-02-03 DIAGNOSIS — Z713 Dietary counseling and surveillance: Secondary | ICD-10-CM | POA: Diagnosis not present

## 2023-04-06 DIAGNOSIS — Z1159 Encounter for screening for other viral diseases: Secondary | ICD-10-CM | POA: Diagnosis not present

## 2023-04-06 DIAGNOSIS — E559 Vitamin D deficiency, unspecified: Secondary | ICD-10-CM | POA: Diagnosis not present

## 2023-04-06 DIAGNOSIS — F411 Generalized anxiety disorder: Secondary | ICD-10-CM | POA: Diagnosis not present

## 2023-04-06 DIAGNOSIS — Z0000 Encounter for general adult medical examination without abnormal findings: Secondary | ICD-10-CM | POA: Diagnosis not present

## 2023-04-06 DIAGNOSIS — Z9884 Bariatric surgery status: Secondary | ICD-10-CM | POA: Diagnosis not present

## 2023-04-06 DIAGNOSIS — Z13220 Encounter for screening for lipoid disorders: Secondary | ICD-10-CM | POA: Diagnosis not present

## 2023-04-06 DIAGNOSIS — E282 Polycystic ovarian syndrome: Secondary | ICD-10-CM | POA: Diagnosis not present

## 2023-05-01 ENCOUNTER — Ambulatory Visit
Admission: RE | Admit: 2023-05-01 | Discharge: 2023-05-01 | Disposition: A | Discharge status: Home / Self Care | Payer: BC Managed Care – PPO | Source: Ambulatory Visit

## 2023-05-01 VITALS — BP 127/84 | HR 72 | Temp 98.0°F | Resp 16

## 2023-05-01 DIAGNOSIS — B9689 Other specified bacterial agents as the cause of diseases classified elsewhere: Secondary | ICD-10-CM | POA: Diagnosis not present

## 2023-05-01 DIAGNOSIS — J329 Chronic sinusitis, unspecified: Secondary | ICD-10-CM | POA: Diagnosis not present

## 2023-05-01 MED ORDER — AMOXICILLIN-POT CLAVULANATE 875-125 MG PO TABS
1.0000 | ORAL_TABLET | Freq: Two times a day (BID) | ORAL | 0 refills | Status: AC
Start: 2023-05-01 — End: ?

## 2023-05-01 MED ORDER — FLUTICASONE PROPIONATE 50 MCG/ACT NA SUSP
1.0000 | Freq: Every day | NASAL | 0 refills | Status: AC
Start: 2023-05-01 — End: ?

## 2023-05-01 MED ORDER — BENZONATATE 200 MG PO CAPS
200.0000 mg | ORAL_CAPSULE | Freq: Three times a day (TID) | ORAL | 0 refills | Status: AC | PRN
Start: 2023-05-01 — End: ?

## 2023-05-01 NOTE — ED Provider Notes (Signed)
[facility]    CSN: [id number] Arrival date & time: 05/01/23  1544      History   Chief Complaint Chief Complaint  Patient presents with   Nasal Congestion    Sinus stuff, cough - Entered by patient   Cough    HPI Alexis Miller is a 32 y.o. female  presents for evaluation of URI symptoms for 5=6 days. Patient reports associated symptoms of sinus pressure/pain with fever and purulent nasal discharge, HA, cough. Denies N/V/D, ST, ear pain or SOB. Patient does have a hx of asthma. Has an inhaler but has not needed to use.  No known sick contacts.  Pt has taken sinus medication OTC for symptoms. Pt has no other concerns at this time.     Cough Associated symptoms: headaches     Past Medical History:  Diagnosis Date   Asthma    Family history of adverse reaction to anesthesia    N & V   PCOS (polycystic ovarian syndrome)    Ruptured lumbar disc 2022    Patient Active Problem List   Diagnosis Date Noted   Herniated nucleus pulposus, L4-5 11/11/2021   Morbid obesity (HCC) 07/02/2015    Past Surgical History:  Procedure Laterality Date   DILATION AND CURETTAGE OF UTERUS  10/13/2009   GASTRIC BYPASS     LUMBAR LAMINECTOMY/DECOMPRESSION MICRODISCECTOMY Bilateral 11/11/2021   Procedure: Bilateral Lumbar four-five Microdiscectomy;  Surgeon: Barnett Abu, MD;  Location: [facility];  Service: Neurosurgery;  Laterality: Bilateral;   TYMPANOSTOMY TUBE PLACEMENT Bilateral 1995   WISDOM TOOTH EXTRACTION Bilateral     OB History   No obstetric history on file.      Home Medications    Prior to Admission medications   Medication Sig Start Date End Date Taking? Authorizing Provider  amoxicillin-clavulanate (AUGMENTIN) 875-125 MG tablet Take 1 tablet by mouth every 12 (twelve) hours. 05/01/23  Yes Radford Pax, NP  benzonatate (TESSALON) 200 MG capsule Take 1 capsule (200 mg total) by mouth 3 (three) times daily as needed for cough. 05/01/23  Yes Radford Pax, NP   fluticasone (FLONASE) 50 MCG/ACT nasal spray Place 1 spray into both nostrils daily. 05/01/23  Yes Radford Pax, NP  acetaminophen (TYLENOL) 500 MG tablet Take 500 mg by mouth every 6 (six) hours as needed for moderate pain.    [provider]  albuterol (VENTOLIN HFA) 108 (90 Base) MCG/ACT inhaler Inhale 2 puffs into the lungs every 6 (six) hours as needed for wheezing or shortness of breath.    [provider]  ergocalciferol (VITAMIN D2) 1.25 MG (50000 UT) capsule Take 50,000 Units by mouth once a week. 11/05/21   [provider]  hydrocortisone (ANUSOL-HC) 2.5 % rectal cream Place 1 Application rectally 2 (two) times daily. 01/15/23   Raspet, Erin K, PA-C  LO LOESTRIN FE 1 MG-10 MCG / 10 MCG tablet Take 1 tablet by mouth daily. 06/09/22   [provider]  loratadine (CLARITIN) 10 MG tablet Take 10 mg by mouth daily.    [provider]  pantoprazole (PROTONIX) 20 MG tablet Take 20 mg by mouth daily.    [provider]    Family History Family History  Problem Relation Age of Onset   Arthritis Mother    Arthritis Father    Hyperlipidemia Father    Hypertension Father    Arthritis Maternal Grandmother    Arthritis Maternal Grandfather    Heart disease Maternal Grandfather  Hypertension Maternal Grandfather    Arthritis Paternal Grandmother    Arthritis Paternal Grandfather    Hypertension Paternal Grandfather     Social History Social History   Tobacco Use   Smoking status: Never   Smokeless tobacco: Never  Vaping Use   Vaping status: Never Used  Substance Use Topics   Alcohol use: No    Alcohol/week: 0.0 standard drinks of alcohol   Drug use: No     Allergies   Cephalosporins and Nsaids   Review of Systems Review of Systems  HENT:  Positive for congestion, sinus pressure and sinus pain.   Respiratory:  Positive for cough.   Neurological:  Positive for headaches.     Physical Exam Triage Vital Signs ED  Triage Vitals [05/01/23 1612]  Encounter Vitals Group     BP 127/84     Systolic BP Percentile      Diastolic BP Percentile      Pulse Rate 72     Resp 16     Temp 98 F (36.7 C)     Temp Source Temporal     SpO2 97 %     Weight      Height      Head Circumference      Peak Flow      Pain Score 0     Pain Loc      Pain Education      Exclude from Growth Chart    No data found.  Updated Vital Signs BP 127/84 (BP Location: Left Arm)   Pulse 72   Temp 98 F (36.7 C) (Temporal)   Resp 16   LMP 05/01/2023   SpO2 97%   Visual Acuity Right Eye Distance:   Left Eye Distance:   Bilateral Distance:    Right Eye Near:   Left Eye Near:    Bilateral Near:     Physical Exam Vitals and nursing note reviewed.  Constitutional:      General: She is not in acute distress.    Appearance: She is well-developed. She is not ill-appearing.  HENT:     Head: Normocephalic and atraumatic.     Right Ear: Tympanic membrane and ear canal normal.     Left Ear: Tympanic membrane and ear canal normal.     Nose: Congestion present.     Right Turbinates: Swollen and pale.     Left Turbinates: Swollen and pale.     Right Sinus: Maxillary sinus tenderness and frontal sinus tenderness present.     Left Sinus: Maxillary sinus tenderness and frontal sinus tenderness present.     Mouth/Throat:     Mouth: Mucous membranes are moist.     Pharynx: Oropharynx is clear. Uvula midline.     Tonsils: No tonsillar exudate or tonsillar abscesses.  Eyes:     Conjunctiva/sclera: Conjunctivae normal.     Pupils: Pupils are equal, round, and reactive to light.  Cardiovascular:     Rate and Rhythm: Normal rate and regular rhythm.     Heart sounds: Normal heart sounds.  Pulmonary:     Effort: Pulmonary effort is normal.     Breath sounds: Normal breath sounds.  Musculoskeletal:     Cervical back: Normal range of motion and neck supple.  Lymphadenopathy:     Cervical: No cervical adenopathy.  Skin:     General: Skin is warm and dry.  Neurological:     General: No focal deficit present.     Mental Status: She  is alert and oriented to person, place, and time.  Psychiatric:        Mood and Affect: Mood normal.        Behavior: Behavior normal.      UC Treatments / Results  Labs (all labs ordered are listed, but only abnormal results are displayed) Labs Reviewed - No data to display  EKG   Radiology No results found.  Procedures Procedures (including critical care time)  Medications Ordered in UC Medications - No data to display  Initial Impression / Assessment and Plan / UC Course  I have reviewed the triage vital signs and the nursing notes.  Pertinent labs & imaging results that were available during my care of the patient were reviewed by me and considered in my medical decision making (see chart for details).     Start Augmentin, flonase, and tessalon. PCP follow up if symptoms do not improve. ER precautions reviewed.  Final Clinical Impressions(s) / UC Diagnoses   Final diagnoses:  Bacterial sinusitis     Discharge Instructions      Augmentin as prescribed Flonase daily Tessalon as needed for cough Lots of rest and fluids Follow up with your PCP if symptoms do not improve.  Please go to the ER for any worsening symptoms I hope you feel better soon!    ED Prescriptions     Medication Sig Dispense Auth. Provider   amoxicillin-clavulanate (AUGMENTIN) 875-125 MG tablet Take 1 tablet by mouth every 12 (twelve) hours. 14 tablet Radford Pax, NP   benzonatate (TESSALON) 200 MG capsule Take 1 capsule (200 mg total) by mouth 3 (three) times daily as needed for cough. 20 capsule Radford Pax, NP   fluticasone (FLONASE) 50 MCG/ACT nasal spray Place 1 spray into both nostrils daily. 15.8 mL Radford Pax, NP      PDMP not reviewed this encounter.   Radford Pax, NP 05/01/23 [phone]

## 2023-05-01 NOTE — ED Triage Notes (Signed)
Patient presents to UC for cough and stuffy nose x 5 days. Taking tylenol cold/flu.

## 2023-05-01 NOTE — Discharge Instructions (Signed)
Augmentin as prescribed Flonase daily Tessalon as needed for cough Lots of rest and fluids Follow up with your PCP if symptoms do not improve.  Please go to the ER for any worsening symptoms I hope you feel better soon!

## 2023-05-05 DIAGNOSIS — Z713 Dietary counseling and surveillance: Secondary | ICD-10-CM | POA: Diagnosis not present

## 2023-05-05 DIAGNOSIS — Z9884 Bariatric surgery status: Secondary | ICD-10-CM | POA: Diagnosis not present

## 2023-05-05 DIAGNOSIS — E669 Obesity, unspecified: Secondary | ICD-10-CM | POA: Diagnosis not present

## 2023-06-02 DIAGNOSIS — F411 Generalized anxiety disorder: Secondary | ICD-10-CM | POA: Diagnosis not present

## 2023-07-09 DIAGNOSIS — Z1151 Encounter for screening for human papillomavirus (HPV): Secondary | ICD-10-CM | POA: Diagnosis not present

## 2023-07-09 DIAGNOSIS — Z01419 Encounter for gynecological examination (general) (routine) without abnormal findings: Secondary | ICD-10-CM | POA: Diagnosis not present

## 2023-07-09 DIAGNOSIS — E559 Vitamin D deficiency, unspecified: Secondary | ICD-10-CM | POA: Diagnosis not present

## 2023-07-29 DIAGNOSIS — N9489 Other specified conditions associated with female genital organs and menstrual cycle: Secondary | ICD-10-CM | POA: Diagnosis not present

## 2023-07-29 DIAGNOSIS — N926 Irregular menstruation, unspecified: Secondary | ICD-10-CM | POA: Diagnosis not present

## 2023-07-29 DIAGNOSIS — N921 Excessive and frequent menstruation with irregular cycle: Secondary | ICD-10-CM | POA: Diagnosis not present

## 2023-08-04 DIAGNOSIS — Z5181 Encounter for therapeutic drug level monitoring: Secondary | ICD-10-CM | POA: Diagnosis not present

## 2023-08-04 DIAGNOSIS — F411 Generalized anxiety disorder: Secondary | ICD-10-CM | POA: Diagnosis not present

## 2023-08-05 DIAGNOSIS — E6609 Other obesity due to excess calories: Secondary | ICD-10-CM | POA: Diagnosis not present

## 2023-08-05 DIAGNOSIS — Z9884 Bariatric surgery status: Secondary | ICD-10-CM | POA: Diagnosis not present

## 2023-08-05 DIAGNOSIS — Z6836 Body mass index (BMI) 36.0-36.9, adult: Secondary | ICD-10-CM | POA: Diagnosis not present

## 2023-08-06 DIAGNOSIS — Z9884 Bariatric surgery status: Secondary | ICD-10-CM | POA: Diagnosis not present

## 2023-08-06 DIAGNOSIS — Z713 Dietary counseling and surveillance: Secondary | ICD-10-CM | POA: Diagnosis not present

## 2023-08-06 DIAGNOSIS — E669 Obesity, unspecified: Secondary | ICD-10-CM | POA: Diagnosis not present

## 2023-08-06 DIAGNOSIS — Z6835 Body mass index (BMI) 35.0-35.9, adult: Secondary | ICD-10-CM | POA: Diagnosis not present

## 2024-04-19 ENCOUNTER — Ambulatory Visit
Admission: RE | Admit: 2024-04-19 | Discharge: 2024-04-19 | Disposition: A | Discharge status: Home / Self Care | Source: Ambulatory Visit | Attending: Emergency Medicine | Admitting: Emergency Medicine

## 2024-04-19 VITALS — BP 110/73 | HR 81 | Temp 97.7°F | Resp 18

## 2024-04-19 DIAGNOSIS — R300 Dysuria: Secondary | ICD-10-CM | POA: Diagnosis present

## 2024-04-19 LAB — POCT URINE PREGNANCY: Preg Test, Ur: NEGATIVE

## 2024-04-19 LAB — POCT URINALYSIS DIP (MANUAL ENTRY)
Glucose, UA: NEGATIVE mg/dL
Nitrite, UA: NEGATIVE
Protein Ur, POC: 100 mg/dL — AB
Spec Grav, UA: 1.02 (ref 1.010–1.025)
Urobilinogen, UA: 0.2 U/dL
pH, UA: 6 (ref 5.0–8.0)

## 2024-04-19 MED ORDER — NITROFURANTOIN MONOHYD MACRO 100 MG PO CAPS: 100.0000 mg | ORAL_CAPSULE | Freq: Two times a day (BID) | ORAL | 0 refills | Status: AC

## 2024-04-19 NOTE — Discharge Instructions (Signed)
 Take the antibiotic as directed.  The urine culture is pending.  We will call you if it shows the need to change or discontinue your antibiotic.    Follow up with your primary care provider if your symptoms are not improving.

## 2024-04-19 NOTE — ED Provider Notes (Signed)
 Alexis Miller    CSN: [id number] Arrival date & time: 04/19/24  1048      History   Chief Complaint Chief Complaint  Patient presents with   Urinary Frequency    Entered by patient    HPI Alexis Miller is a 33 y.o. female.  Patient presents with 2-day history of dysuria, urinary frequency, bladder pressure, low back pain.  No fever, hematuria, abdominal pain, vaginal discharge, pelvic pain.  No OTC medication taken today.  The history is provided by the patient and medical records.    Past Medical History:  Diagnosis Date   Asthma    Family history of adverse reaction to anesthesia    N & V   PCOS (polycystic ovarian syndrome)    Ruptured lumbar disc 2022    Patient Active Problem List   Diagnosis Date Noted   Herniated nucleus pulposus, L4-5 11/11/2021   Morbid obesity (HCC) 07/02/2015    Past Surgical History:  Procedure Laterality Date   DILATION AND CURETTAGE OF UTERUS  10/13/2009   GASTRIC BYPASS     LUMBAR LAMINECTOMY/DECOMPRESSION MICRODISCECTOMY Bilateral 11/11/2021   Procedure: Bilateral Lumbar four-five Microdiscectomy;  Surgeon: Colon Shove, MD;  Location: [facility];  Service: Neurosurgery;  Laterality: Bilateral;   TYMPANOSTOMY TUBE PLACEMENT Bilateral 1995   WISDOM TOOTH EXTRACTION Bilateral     OB History   No obstetric history on file.      Home Medications    Prior to Admission medications   Medication Sig Start Date End Date Taking? Authorizing Provider  nitrofurantoin , macrocrystal-monohydrate, (MACROBID ) 100 MG capsule Take 1 capsule (100 mg total) by mouth 2 (two) times daily. 04/19/24  Yes Corlis Burnard DEL, NP  acetaminophen  (TYLENOL ) 500 MG tablet Take 500 mg by mouth every 6 (six) hours as needed for moderate pain.    [provider]  albuterol  (VENTOLIN  HFA) 108 (90 Base) MCG/ACT inhaler Inhale 2 puffs into the lungs every 6 (six) hours as needed for wheezing or shortness of breath.    [provider]   amoxicillin -clavulanate (AUGMENTIN ) 875-125 MG tablet Take 1 tablet by mouth every 12 (twelve) hours. Patient not taking: Reported on 04/19/2024 05/01/23   Mayer, Jodi R, NP  benzonatate  (TESSALON ) 200 MG capsule Take 1 capsule (200 mg total) by mouth 3 (three) times daily as needed for cough. Patient not taking: Reported on 04/19/2024 05/01/23   Mayer, Jodi R, NP  ergocalciferol (VITAMIN D2) 1.25 MG (50000 UT) capsule Take 50,000 Units by mouth once a week. 11/05/21   [provider]  fluticasone  (FLONASE ) 50 MCG/ACT nasal spray Place 1 spray into both nostrils daily. 05/01/23   Mayer, Jodi R, NP  hydrocortisone  (ANUSOL -HC) 2.5 % rectal cream Place 1 Application rectally 2 (two) times daily. 01/15/23   Raspet, Erin K, PA-C  LO LOESTRIN FE 1 MG-10 MCG / 10 MCG tablet Take 1 tablet by mouth daily. 06/09/22   [provider]  loratadine  (CLARITIN ) 10 MG tablet Take 10 mg by mouth daily.    [provider]  pantoprazole  (PROTONIX ) 20 MG tablet Take 20 mg by mouth daily.    [provider]    Family History Family History  Problem Relation Age of Onset   Arthritis Mother    Arthritis Father    Hyperlipidemia Father    Hypertension Father    Arthritis Maternal Grandmother    Arthritis Maternal Grandfather    Heart disease Maternal Grandfather    Hypertension Maternal Grandfather  Arthritis Paternal Grandmother    Arthritis Paternal Grandfather    Hypertension Paternal Grandfather     Social History Social History   Tobacco Use   Smoking status: Never   Smokeless tobacco: Never  Vaping Use   Vaping status: Never Used  Substance Use Topics   Alcohol use: No    Alcohol/week: 0.0 standard drinks of alcohol   Drug use: No     Allergies   Cephalosporins and Nsaids   Review of Systems Review of Systems  Constitutional:  Negative for chills and fever.  Gastrointestinal:  Negative for abdominal pain and nausea.  Genitourinary:  Positive for dysuria  and frequency. Negative for flank pain, hematuria, pelvic pain and vaginal discharge.  Musculoskeletal:  Positive for back pain.     Physical Exam Triage Vital Signs ED Triage Vitals [04/19/24 1110]  Encounter Vitals Group     BP      Girls Systolic BP Percentile      Girls Diastolic BP Percentile      Boys Systolic BP Percentile      Boys Diastolic BP Percentile      Pulse      Resp      Temp      Temp src      SpO2      Weight      Height      Head Circumference      Peak Flow      Pain Score 6     Pain Loc      Pain Education      Exclude from Growth Chart    No data found.  Updated Vital Signs BP 110/73   Pulse 81   Temp 97.7 F (36.5 C)   Resp 18   LMP 04/06/2024   SpO2 99%   Visual Acuity Right Eye Distance:   Left Eye Distance:   Bilateral Distance:    Right Eye Near:   Left Eye Near:    Bilateral Near:     Physical Exam Constitutional:      General: She is not in acute distress. HENT:     Mouth/Throat:     Mouth: Mucous membranes are moist.  Cardiovascular:     Rate and Rhythm: Normal rate and regular rhythm.  Pulmonary:     Effort: Pulmonary effort is normal. No respiratory distress.  Abdominal:     General: Bowel sounds are normal.     Palpations: Abdomen is soft.     Tenderness: There is no abdominal tenderness. There is no right CVA tenderness, left CVA tenderness, guarding or rebound.  Neurological:     Mental Status: She is alert.      UC Treatments / Results  Labs (all labs ordered are listed, but only abnormal results are displayed) Labs Reviewed  POCT URINALYSIS DIP (MANUAL ENTRY) - Abnormal; Notable for the following components:      Result Value   Clarity, UA turbid (*)    Bilirubin, UA small (*)    Ketones, POC UA trace (5) (*)    Blood, UA moderate (*)    Protein Ur, POC =100 (*)    Leukocytes, UA Small (1+) (*)    All other components within normal limits  POCT URINE PREGNANCY - Normal  URINE CULTURE     EKG   Radiology No results found.  Procedures Procedures (including critical care time)  Medications Ordered in UC Medications - No data to display  Initial Impression / Assessment and Plan /  UC Course  I have reviewed the triage vital signs and the nursing notes.  Pertinent labs & imaging results that were available during my care of the patient were reviewed by me and considered in my medical decision making (see chart for details).    Dysuria.  Treating with Macrobid . Urine culture pending. Discussed with patient that we will call her if the urine culture shows the need to change or discontinue the antibiotic. Instructed her to follow-up with her PCP if her symptoms are not improving. Patient agrees to plan of care.     Final Clinical Impressions(s) / UC Diagnoses   Final diagnoses:  Dysuria     Discharge Instructions      Take the antibiotic as directed.  The urine culture is pending.  We will call you if it shows the need to change or discontinue your antibiotic.    Follow-up with your primary care provider if your symptoms are not improving.      ED Prescriptions     Medication Sig Dispense Auth. Provider   nitrofurantoin , macrocrystal-monohydrate, (MACROBID ) 100 MG capsule Take 1 capsule (100 mg total) by mouth 2 (two) times daily. 10 capsule Corlis Burnard DEL, NP      PDMP not reviewed this encounter.   Corlis Burnard DEL, NP 04/19/24 1150

## 2024-04-19 NOTE — ED Triage Notes (Signed)
 Patient to Urgent Care with complaints of urinary frequency/ dysuria/ pressure/ back pain.   Symptoms started 2 days ago- worse last night.

## 2024-04-22 ENCOUNTER — Ambulatory Visit (HOSPITAL_COMMUNITY): Payer: Self-pay

## 2024-04-22 LAB — URINE CULTURE

## 2024-12-27 ENCOUNTER — Ambulatory Visit: Admitting: Cardiology
